# Patient Record
Sex: Female | Born: 1950
Health system: Southern US, Community
[De-identification: ages and names within clinical notes are randomized; demographics above are authoritative.]

## PROBLEM LIST (undated history)

## (undated) DIAGNOSIS — I10 Essential (primary) hypertension: Secondary | ICD-10-CM

## (undated) DIAGNOSIS — K219 Gastro-esophageal reflux disease without esophagitis: Secondary | ICD-10-CM

## (undated) DIAGNOSIS — H269 Unspecified cataract: Secondary | ICD-10-CM

## (undated) DIAGNOSIS — R0602 Shortness of breath: Secondary | ICD-10-CM

## (undated) DIAGNOSIS — A879 Viral meningitis, unspecified: Secondary | ICD-10-CM

## (undated) DIAGNOSIS — M8580 Other specified disorders of bone density and structure, unspecified site: Secondary | ICD-10-CM

## (undated) HISTORY — DX: Gastro-esophageal reflux disease without esophagitis: K21.9

## (undated) HISTORY — PX: MAXILLARY SINUS LIFT: SHX5206

## (undated) HISTORY — PX: HIATAL HERNIA REPAIR: SHX195

## (undated) HISTORY — DX: Unspecified cataract: H26.9

## (undated) HISTORY — PX: TUBAL LIGATION: SHX77

## (undated) HISTORY — PX: AUGMENTATION MAMMAPLASTY: SUR837

## (undated) HISTORY — DX: Other specified disorders of bone density and structure, unspecified site: M85.80

## (undated) HISTORY — PX: ABDOMINAL HYSTERECTOMY: SHX81

---

## 1998-03-08 ENCOUNTER — Ambulatory Visit: Admission: RE | Admit: 1998-03-08 | Discharge: 1998-03-08 | Payer: Self-pay | Admitting: Obstetrics & Gynecology

## 1998-12-25 ENCOUNTER — Ambulatory Visit (HOSPITAL_COMMUNITY): Admission: RE | Admit: 1998-12-25 | Discharge: 1998-12-25 | Payer: Self-pay | Admitting: Obstetrics and Gynecology

## 1999-05-13 ENCOUNTER — Ambulatory Visit (HOSPITAL_COMMUNITY): Admission: RE | Admit: 1999-05-13 | Discharge: 1999-05-13 | Payer: Self-pay | Admitting: Obstetrics and Gynecology

## 2000-02-25 ENCOUNTER — Other Ambulatory Visit: Admission: RE | Admit: 2000-02-25 | Discharge: 2000-02-25 | Payer: Self-pay | Admitting: Obstetrics and Gynecology

## 2000-09-07 ENCOUNTER — Ambulatory Visit (HOSPITAL_COMMUNITY): Admission: RE | Admit: 2000-09-07 | Discharge: 2000-09-07 | Payer: Self-pay | Admitting: Obstetrics and Gynecology

## 2000-09-07 ENCOUNTER — Encounter: Payer: Self-pay | Admitting: Obstetrics and Gynecology

## 2001-03-16 ENCOUNTER — Ambulatory Visit (HOSPITAL_BASED_OUTPATIENT_CLINIC_OR_DEPARTMENT_OTHER): Admission: RE | Admit: 2001-03-16 | Discharge: 2001-03-16 | Payer: Self-pay | Admitting: *Deleted

## 2001-03-16 ENCOUNTER — Encounter (INDEPENDENT_AMBULATORY_CARE_PROVIDER_SITE_OTHER): Payer: Self-pay | Admitting: Specialist

## 2001-03-18 ENCOUNTER — Encounter: Admission: RE | Admit: 2001-03-18 | Discharge: 2001-03-18 | Payer: Self-pay | Admitting: Internal Medicine

## 2001-03-18 ENCOUNTER — Encounter: Payer: Self-pay | Admitting: Internal Medicine

## 2001-05-04 ENCOUNTER — Other Ambulatory Visit: Admission: RE | Admit: 2001-05-04 | Discharge: 2001-05-04 | Payer: Self-pay | Admitting: Obstetrics and Gynecology

## 2001-09-08 ENCOUNTER — Encounter: Payer: Self-pay | Admitting: Obstetrics and Gynecology

## 2001-09-08 ENCOUNTER — Ambulatory Visit (HOSPITAL_COMMUNITY): Admission: RE | Admit: 2001-09-08 | Discharge: 2001-09-08 | Payer: Self-pay | Admitting: Obstetrics and Gynecology

## 2002-08-01 ENCOUNTER — Other Ambulatory Visit: Admission: RE | Admit: 2002-08-01 | Discharge: 2002-08-01 | Payer: Self-pay | Admitting: Obstetrics and Gynecology

## 2002-08-03 ENCOUNTER — Encounter: Payer: Self-pay | Admitting: Obstetrics and Gynecology

## 2002-08-03 ENCOUNTER — Encounter: Admission: RE | Admit: 2002-08-03 | Discharge: 2002-08-03 | Payer: Self-pay | Admitting: Obstetrics and Gynecology

## 2002-09-14 ENCOUNTER — Ambulatory Visit (HOSPITAL_COMMUNITY): Admission: RE | Admit: 2002-09-14 | Discharge: 2002-09-14 | Payer: Self-pay | Admitting: Obstetrics and Gynecology

## 2002-09-14 ENCOUNTER — Encounter: Payer: Self-pay | Admitting: Obstetrics and Gynecology

## 2003-08-03 ENCOUNTER — Encounter: Payer: Self-pay | Admitting: Infectious Diseases

## 2003-08-03 ENCOUNTER — Ambulatory Visit (HOSPITAL_COMMUNITY): Admission: RE | Admit: 2003-08-03 | Discharge: 2003-08-03 | Payer: Self-pay | Admitting: Infectious Diseases

## 2003-08-07 ENCOUNTER — Other Ambulatory Visit: Admission: RE | Admit: 2003-08-07 | Discharge: 2003-08-07 | Payer: Self-pay | Admitting: Obstetrics and Gynecology

## 2003-09-25 ENCOUNTER — Ambulatory Visit (HOSPITAL_COMMUNITY): Admission: RE | Admit: 2003-09-25 | Discharge: 2003-09-25 | Payer: Self-pay | Admitting: Obstetrics and Gynecology

## 2003-11-14 ENCOUNTER — Ambulatory Visit (HOSPITAL_BASED_OUTPATIENT_CLINIC_OR_DEPARTMENT_OTHER): Admission: RE | Admit: 2003-11-14 | Discharge: 2003-11-14 | Payer: Self-pay | Admitting: Plastic Surgery

## 2005-03-17 ENCOUNTER — Other Ambulatory Visit: Admission: RE | Admit: 2005-03-17 | Discharge: 2005-03-17 | Payer: Self-pay | Admitting: Obstetrics and Gynecology

## 2005-03-23 ENCOUNTER — Ambulatory Visit (HOSPITAL_COMMUNITY): Admission: RE | Admit: 2005-03-23 | Discharge: 2005-03-23 | Payer: Self-pay | Admitting: Obstetrics and Gynecology

## 2005-04-01 ENCOUNTER — Ambulatory Visit (HOSPITAL_COMMUNITY): Admission: RE | Admit: 2005-04-01 | Discharge: 2005-04-01 | Payer: Self-pay | Admitting: Obstetrics and Gynecology

## 2006-05-11 ENCOUNTER — Ambulatory Visit (HOSPITAL_COMMUNITY): Admission: RE | Admit: 2006-05-11 | Discharge: 2006-05-11 | Payer: Self-pay | Admitting: Obstetrics and Gynecology

## 2007-05-18 ENCOUNTER — Ambulatory Visit (HOSPITAL_COMMUNITY): Admission: RE | Admit: 2007-05-18 | Discharge: 2007-05-18 | Payer: Self-pay | Admitting: Obstetrics and Gynecology

## 2007-11-24 DIAGNOSIS — M8580 Other specified disorders of bone density and structure, unspecified site: Secondary | ICD-10-CM

## 2007-11-24 HISTORY — DX: Other specified disorders of bone density and structure, unspecified site: M85.80

## 2007-12-20 ENCOUNTER — Ambulatory Visit: Payer: Self-pay | Admitting: Internal Medicine

## 2008-01-03 ENCOUNTER — Ambulatory Visit: Payer: Self-pay | Admitting: Internal Medicine

## 2008-01-03 ENCOUNTER — Encounter: Payer: Self-pay | Admitting: Internal Medicine

## 2008-04-30 ENCOUNTER — Other Ambulatory Visit: Admission: RE | Admit: 2008-04-30 | Discharge: 2008-04-30 | Payer: Self-pay | Admitting: Family Medicine

## 2008-05-16 ENCOUNTER — Ambulatory Visit (HOSPITAL_COMMUNITY): Admission: RE | Admit: 2008-05-16 | Discharge: 2008-05-16 | Payer: Self-pay | Admitting: Family Medicine

## 2008-05-22 ENCOUNTER — Ambulatory Visit (HOSPITAL_COMMUNITY): Admission: RE | Admit: 2008-05-22 | Discharge: 2008-05-22 | Payer: Self-pay | Admitting: Family Medicine

## 2009-05-24 ENCOUNTER — Ambulatory Visit (HOSPITAL_COMMUNITY): Admission: RE | Admit: 2009-05-24 | Discharge: 2009-05-24 | Payer: Self-pay | Admitting: Family Medicine

## 2009-06-06 ENCOUNTER — Other Ambulatory Visit: Admission: RE | Admit: 2009-06-06 | Discharge: 2009-06-06 | Payer: Self-pay | Admitting: Family Medicine

## 2010-05-28 ENCOUNTER — Ambulatory Visit (HOSPITAL_COMMUNITY): Admission: RE | Admit: 2010-05-28 | Discharge: 2010-05-28 | Payer: Self-pay | Admitting: Family Medicine

## 2010-07-22 ENCOUNTER — Other Ambulatory Visit: Admission: RE | Admit: 2010-07-22 | Discharge: 2010-07-22 | Payer: Self-pay | Admitting: Family Medicine

## 2010-12-13 ENCOUNTER — Encounter: Payer: Self-pay | Admitting: Obstetrics and Gynecology

## 2011-04-10 NOTE — Op Note (Signed)
[place]  Patient:    Alexis Guerrero, Alexis Guerrero                      MRN: [id number] Proc. Date: 03/16/01 Adm. Date:  [id number] Attending:  Aundria Mems                           Operative Report  PREOPERATIVE DIAGNOSIS: 1. Bilateral nasal/ethmoid polyposis. 2. Recurrent ethmoid maxillary sinusitis. 3. Hyperplastic nasal turbinates. 4. Allergic rhinitis.  POSTOPERATIVE DIAGNOSIS: 1. Bilateral nasal/ethmoid polyposis. 2. Recurrent ethmoid maxillary sinusitis. 3. Hyperplastic nasal turbinates. 4. Allergic rhinitis.  OPERATION PERFORMED: 1. Bilateral endoscopic complete ethmoidectomies. 2. Bilateral endoscopic osteomeatal openings. 3. Bilateral outfracture of inferior turbinates.  SURGEON:  Kathy Breach, M.D.  ANESTHESIA:  General orotracheal.  DESCRIPTION OF PROCEDURE:  With the patient under general orotracheal anesthesia, nasal block with 4% Xylocaine with ephedrine solution on olive tipped probes applied to the sphenopalatine and anterior ethmoid nerve areas bilaterally.  Nasal packs soaked in similar solution inserted along the middle and inferior turbinates bilaterally.  Middle meatal mucosa and lateral middle turbinate mucosa infiltrated with 1% Xylocaine with 1:100,000 epinephrine bilaterally for vasoconstriction.  Inspection of the patients nose showed near midline straight septum, prominent inferior and middle turbinates bilaterally.  The middle meatal mucosa peeking out under turbinates, early polypoid disease.  Also polypoid degeneration of the mucosa on the medial posterolateral surface of the left middle turbinate more so than the right. Under endoscopic visualization beginning on the left side with suction debrider, polypoid disease was taken down, taking down the anterior ethmoid cells through the vertical portion of the lamellar plate and the posterior cells, all ethmoid cells filled completely with polypoid mucosa and  tissue. No purulence was encountered.  Complete ethmoidectomy done initially on the left side.  Area of the osteomeatal window probed to find the area and opened to give the opening through the osteomeatal complex into the left maxillary sinus.  Similar procedure with similar findings done on the right side, essentially completing a complete endoscopic ethmoidectomy bilaterally and opening the ostial areas into the maxillary sinuses.  Both prominent inferior turbinates were then outfractured.  The patient had no significant bleeding at completion of the case.  Estimated blood loss for this procedure was perhaps 200 cc but impossible to judge with using the suction debrider and constant irrigation throughout.  The patients oropharynx was cleared, no significant bleeding down to the posterior pharyngeal wall at completion of the case.  The patient tolerated the procedure well and was taken to the recovery room in stable general condition. DD:  03/16/01 TD:  03/16/01 Job: [id number]

## 2011-04-10 NOTE — Op Note (Signed)
NAME:  Alexis Guerrero, Alexis Guerrero                         ACCOUNT NO.:  [id number]   MEDICAL RECORD NO.:  [id number]                   PATIENT TYPE:  AMB   LOCATION:  DSC                                  FACILITY:  [facility]   PHYSICIAN:  Alfredia Ferguson, M.D.               DATE OF BIRTH:  May 16, 1951   DATE OF PROCEDURE:  11/14/2003  DATE OF DISCHARGE:                                 OPERATIVE REPORT   PREOPERATIVE DIAGNOSIS:  A 9 mm epidermal inclusion cyst, right preauricular  area.   POSTOPERATIVE DIAGNOSIS:  A 9 mm epidermal inclusion cyst, right  preauricular area.   OPERATION PERFORMED:  Elliptical excision of epidermal inclusion cyst, right  preauricular area.   SURGEON:  Alfredia Ferguson, M.D.   ANESTHESIA:  2% Xylocaine with 1:100,000 epinephrine.   INDICATIONS FOR PROCEDURE:  The patient is a 60 year old woman with a slowly  enlarging cyst in her right preauricular area.  It has been chronically  draining.  She wishes to have it excised before it had a chance to get  infected.  She understands that she is trading what she has for a permanent  potentially unsightly preauricular scar.  In spite of that she wished to  proceed with the surgery.   DESCRIPTION OF PROCEDURE:  Skin markers were placed in elliptical fashion  around the diseased punctum in the right preauricular area.  The incision  was oriented in a vertical direction.  Local anesthesia was infiltrated and  the right face was prepped with Betadine and draped with sterile drapes.  Elliptical excision of the skin was made down to the subcutaneous tissue.  Using sharp dissection, the cyst was identified and dissected away from the  surrounding subcutaneous tissue.  It was removed in total.  Hemostasis was  accomplished using cautery.  Specimen was not sent to pathology since it was  a simple cyst.  The wound was closed by approximating the dermis using  multiple interrupted 5-0 Monocryl for the dermis.  Skin edges were united  using Steri-Strips.  A light dressing was applied and the patient was  discharged to home in satisfactory condition.                                               Alfredia Ferguson, M.D.    WBB/MEDQ  D:  11/14/2003  T:  11/15/2003  Job:  [id number]

## 2011-05-14 ENCOUNTER — Other Ambulatory Visit (INDEPENDENT_AMBULATORY_CARE_PROVIDER_SITE_OTHER): Payer: Self-pay | Admitting: Otolaryngology

## 2011-05-14 DIAGNOSIS — J320 Chronic maxillary sinusitis: Secondary | ICD-10-CM

## 2011-05-15 ENCOUNTER — Ambulatory Visit
Admission: RE | Admit: 2011-05-15 | Discharge: 2011-05-15 | Disposition: A | Discharge status: Home / Self Care | Payer: Commercial Managed Care - PPO | Source: Ambulatory Visit | Attending: Otolaryngology | Admitting: Otolaryngology

## 2011-05-15 ENCOUNTER — Other Ambulatory Visit: Payer: Self-pay

## 2011-05-15 DIAGNOSIS — J320 Chronic maxillary sinusitis: Secondary | ICD-10-CM

## 2011-05-25 ENCOUNTER — Other Ambulatory Visit (HOSPITAL_COMMUNITY): Payer: Self-pay | Admitting: Family Medicine

## 2011-05-25 DIAGNOSIS — Z1231 Encounter for screening mammogram for malignant neoplasm of breast: Secondary | ICD-10-CM

## 2011-06-08 ENCOUNTER — Ambulatory Visit (HOSPITAL_COMMUNITY)
Admission: RE | Admit: 2011-06-08 | Discharge: 2011-06-08 | Disposition: A | Discharge status: Home / Self Care | Payer: 59 | Source: Ambulatory Visit | Attending: Family Medicine | Admitting: Family Medicine

## 2011-06-08 DIAGNOSIS — Z1231 Encounter for screening mammogram for malignant neoplasm of breast: Secondary | ICD-10-CM | POA: Insufficient documentation

## 2011-08-20 ENCOUNTER — Other Ambulatory Visit: Payer: Self-pay | Admitting: Family Medicine

## 2011-08-20 ENCOUNTER — Other Ambulatory Visit (HOSPITAL_COMMUNITY)
Admission: RE | Admit: 2011-08-20 | Discharge: 2011-08-20 | Disposition: A | Discharge status: Home / Self Care | Payer: 59 | Source: Ambulatory Visit | Attending: Family Medicine | Admitting: Family Medicine

## 2011-08-20 DIAGNOSIS — Z124 Encounter for screening for malignant neoplasm of cervix: Secondary | ICD-10-CM | POA: Insufficient documentation

## 2011-08-21 ENCOUNTER — Other Ambulatory Visit (HOSPITAL_COMMUNITY): Payer: Self-pay | Admitting: Family Medicine

## 2011-08-21 DIAGNOSIS — M8580 Other specified disorders of bone density and structure, unspecified site: Secondary | ICD-10-CM

## 2011-09-01 ENCOUNTER — Ambulatory Visit (HOSPITAL_COMMUNITY): Payer: Commercial Managed Care - PPO

## 2011-09-03 ENCOUNTER — Ambulatory Visit (HOSPITAL_COMMUNITY)
Admission: RE | Admit: 2011-09-03 | Discharge: 2011-09-03 | Disposition: A | Discharge status: Home / Self Care | Payer: 59 | Source: Ambulatory Visit | Attending: Family Medicine | Admitting: Family Medicine

## 2011-09-03 DIAGNOSIS — M8580 Other specified disorders of bone density and structure, unspecified site: Secondary | ICD-10-CM

## 2011-09-03 DIAGNOSIS — Z13820 Encounter for screening for osteoporosis: Secondary | ICD-10-CM | POA: Insufficient documentation

## 2011-09-03 DIAGNOSIS — Z780 Asymptomatic menopausal state: Secondary | ICD-10-CM | POA: Insufficient documentation

## 2011-10-13 ENCOUNTER — Encounter (HOSPITAL_COMMUNITY): Payer: Self-pay

## 2011-10-13 ENCOUNTER — Encounter (HOSPITAL_COMMUNITY)
Admission: RE | Admit: 2011-10-13 | Discharge: 2011-10-13 | Disposition: A | Discharge status: Home / Self Care | Payer: 59 | Source: Ambulatory Visit | Attending: Ophthalmology | Admitting: Ophthalmology

## 2011-10-13 ENCOUNTER — Other Ambulatory Visit: Payer: Self-pay

## 2011-10-13 ENCOUNTER — Encounter (HOSPITAL_COMMUNITY): Payer: Self-pay | Admitting: Pharmacy Technician

## 2011-10-13 HISTORY — DX: Shortness of breath: R06.02

## 2011-10-13 HISTORY — DX: Essential (primary) hypertension: I10

## 2011-10-13 NOTE — Patient Instructions (Addendum)
20 Alexis Guerrero  10/13/2011   Your procedure is scheduled on:  10/20/2011  Report to [facility] at  615  AM.  Call this number if you have problems the morning of surgery: [phone]   Remember:   Do not eat food:After Midnight.  Do not drink clear liquids: After Midnight.  Take these medicines the morning of surgery with A SIP OF WATER: Benicar,clartin. Take symbicort before you come.   Do not wear jewelry, make-up or nail polish.  Do not wear lotions, powders, or perfumes. You may wear deodorant.  Do not shave 48 hours prior to surgery.  Do not bring valuables to the hospital.  Contacts, dentures or bridgework may not be worn into surgery.  Leave suitcase in the car. After surgery it may be brought to your room.  For patients admitted to the hospital, checkout time is 11:00 AM the day of discharge.   Patients discharged the day of surgery will not be allowed to drive home.  Name and phone number of your driver: family  Special Instructions: N/A   Please read over the following fact sheets that you were given: Pain Booklet, MRSA Information, Surgical Site Infection Prevention, Anesthesia Post-op Instructions and Care and Recovery After Surgery PATIENT INSTRUCTIONS POST-ANESTHESIA  IMMEDIATELY FOLLOWING SURGERY:  Do not drive or operate machinery for the first twenty four hours after surgery.  Do not make any important decisions for twenty four hours after surgery or while taking narcotic pain medications or sedatives.  If you develop intractable nausea and vomiting or a severe headache please notify your doctor immediately.  FOLLOW-UP:  Please make an appointment with your surgeon as instructed. You do not need to follow up with anesthesia unless specifically instructed to do so.  WOUND CARE INSTRUCTIONS (if applicable):  Keep a dry clean dressing on the anesthesia/puncture wound site if there is drainage.  Once the wound has quit draining you may leave it open to air.  Generally  you should leave the bandage intact for twenty four hours unless there is drainage.  If the epidural site drains for more than 36-48 hours please call the anesthesia department.  QUESTIONS?:  Please feel free to call your physician or the hospital operator if you have any questions, and they will be happy to assist you.     [facility] Anesthesia Department [address] [place] [phone]

## 2011-10-20 ENCOUNTER — Encounter (HOSPITAL_COMMUNITY)
Admission: RE | Disposition: A | Discharge status: Home / Self Care | Payer: Self-pay | Source: Ambulatory Visit | Attending: Ophthalmology

## 2011-10-20 ENCOUNTER — Encounter (HOSPITAL_COMMUNITY): Payer: Self-pay | Admitting: Anesthesiology

## 2011-10-20 ENCOUNTER — Encounter (HOSPITAL_COMMUNITY): Payer: Self-pay | Admitting: *Deleted

## 2011-10-20 ENCOUNTER — Ambulatory Visit (HOSPITAL_COMMUNITY)
Admission: RE | Admit: 2011-10-20 | Discharge: 2011-10-20 | Disposition: A | Discharge status: Home / Self Care | Payer: 59 | Source: Ambulatory Visit | Attending: Ophthalmology | Admitting: Ophthalmology

## 2011-10-20 ENCOUNTER — Ambulatory Visit (HOSPITAL_COMMUNITY): Payer: 59 | Admitting: Anesthesiology

## 2011-10-20 DIAGNOSIS — Z79899 Other long term (current) drug therapy: Secondary | ICD-10-CM | POA: Insufficient documentation

## 2011-10-20 DIAGNOSIS — I10 Essential (primary) hypertension: Secondary | ICD-10-CM | POA: Insufficient documentation

## 2011-10-20 DIAGNOSIS — H2510 Age-related nuclear cataract, unspecified eye: Secondary | ICD-10-CM | POA: Insufficient documentation

## 2011-10-20 DIAGNOSIS — Z01812 Encounter for preprocedural laboratory examination: Secondary | ICD-10-CM | POA: Insufficient documentation

## 2011-10-20 DIAGNOSIS — Z01810 Encounter for preprocedural cardiovascular examination: Secondary | ICD-10-CM | POA: Insufficient documentation

## 2011-10-20 HISTORY — PX: CATARACT EXTRACTION W/PHACO: SHX586

## 2011-10-20 SURGERY — PHACOEMULSIFICATION, CATARACT, WITH IOL INSERTION
Anesthesia: Monitor Anesthesia Care | Site: Eye | Laterality: Right | Wound class: Clean

## 2011-10-20 MED ORDER — LIDOCAINE HCL (PF) 2 % IJ SOLN
INTRAMUSCULAR | Status: AC
  Filled 2011-10-20: qty 2

## 2011-10-20 MED ORDER — TETRACAINE HCL 0.5 % OP SOLN
1.0000 [drp] | OPHTHALMIC | Status: AC
  Administered 2011-10-20 (×3): 1 [drp] via OPHTHALMIC

## 2011-10-20 MED ORDER — BSS IO SOLN
INTRAOCULAR | Status: DC | PRN
  Administered 2011-10-20: 15 mL via INTRAOCULAR

## 2011-10-20 MED ORDER — MIDAZOLAM HCL 2 MG/2ML IJ SOLN
1.0000 mg | INTRAMUSCULAR | Status: DC | PRN
  Administered 2011-10-20 (×2): 2 mg via INTRAVENOUS

## 2011-10-20 MED ORDER — MIDAZOLAM HCL 2 MG/2ML IJ SOLN
INTRAMUSCULAR | Status: AC
  Filled 2011-10-20: qty 2

## 2011-10-20 MED ORDER — EPINEPHRINE HCL 1 MG/ML IJ SOLN
INTRAMUSCULAR | Status: AC
  Filled 2011-10-20: qty 1

## 2011-10-20 MED ORDER — EPINEPHRINE HCL 1 MG/ML IJ SOLN
INTRAOCULAR | Status: DC | PRN
  Administered 2011-10-20: 07:00:00

## 2011-10-20 MED ORDER — TETRACAINE HCL 0.5 % OP SOLN
OPHTHALMIC | Status: AC
  Administered 2011-10-20: 1 [drp] via OPHTHALMIC
  Filled 2011-10-20: qty 2

## 2011-10-20 MED ORDER — FLURBIPROFEN SODIUM 0.03 % OP SOLN
OPHTHALMIC | Status: AC
  Administered 2011-10-20: 1 [drp] via OPHTHALMIC
  Filled 2011-10-20: qty 2.5

## 2011-10-20 MED ORDER — LACTATED RINGERS IV SOLN
INTRAVENOUS | Status: DC
  Administered 2011-10-20: 1000 mL via INTRAVENOUS

## 2011-10-20 MED ORDER — PHENYLEPHRINE HCL 2.5 % OP SOLN
1.0000 [drp] | OPHTHALMIC | Status: AC
  Administered 2011-10-20 (×3): 1 [drp] via OPHTHALMIC

## 2011-10-20 MED ORDER — MIDAZOLAM HCL 5 MG/5ML IJ SOLN
INTRAMUSCULAR | Status: DC | PRN
  Administered 2011-10-20: 2 mg via INTRAVENOUS

## 2011-10-20 MED ORDER — TETRACAINE 0.5 % OP SOLN OPTIME - NO CHARGE
OPHTHALMIC | Status: DC | PRN
  Administered 2011-10-20: 2 [drp] via OPHTHALMIC

## 2011-10-20 MED ORDER — MIDAZOLAM HCL 2 MG/2ML IJ SOLN
INTRAMUSCULAR | Status: AC
  Administered 2011-10-20: 2 mg via INTRAVENOUS
  Filled 2011-10-20: qty 2

## 2011-10-20 MED ORDER — PHENYLEPHRINE HCL 2.5 % OP SOLN
OPHTHALMIC | Status: AC
  Administered 2011-10-20: 1 [drp] via OPHTHALMIC
  Filled 2011-10-20: qty 2

## 2011-10-20 MED ORDER — CYCLOPENTOLATE-PHENYLEPHRINE 0.2-1 % OP SOLN
1.0000 [drp] | OPHTHALMIC | Status: AC
  Administered 2011-10-20 (×3): 1 [drp] via OPHTHALMIC

## 2011-10-20 MED ORDER — LIDOCAINE HCL (PF) 2 % IJ SOLN
INTRAMUSCULAR | Status: DC | PRN
  Administered 2011-10-20: .5 mL

## 2011-10-20 MED ORDER — FLURBIPROFEN SODIUM 0.03 % OP SOLN
1.0000 [drp] | OPHTHALMIC | Status: AC
  Administered 2011-10-20 (×3): 1 [drp] via OPHTHALMIC

## 2011-10-20 MED ORDER — CYCLOPENTOLATE-PHENYLEPHRINE 0.2-1 % OP SOLN
OPHTHALMIC | Status: AC
  Administered 2011-10-20: 1 [drp] via OPHTHALMIC
  Filled 2011-10-20: qty 2

## 2011-10-20 MED ORDER — PROVISC 10 MG/ML IO SOLN
INTRAOCULAR | Status: DC | PRN
  Administered 2011-10-20: 8.5 mg via INTRAOCULAR

## 2011-10-20 SURGICAL SUPPLY — 21 items
CAPSULAR TENSION RING-AMO (OPHTHALMIC RELATED) IMPLANT
CLOTH BEACON ORANGE TIMEOUT ST (SAFETY) ×2 IMPLANT
EYE SHIELD UNIVERSAL CLEAR (GAUZE/BANDAGES/DRESSINGS) ×2 IMPLANT
GLOVE BIOGEL M 6.5 STRL (GLOVE) ×2 IMPLANT
GLOVE ECLIPSE 6.5 STRL STRAW (GLOVE) IMPLANT
GLOVE ECLIPSE 7.0 STRL STRAW (GLOVE) IMPLANT
GLOVE EXAM NITRILE LRG STRL (GLOVE) ×2 IMPLANT
GLOVE EXAM NITRILE MD LF STRL (GLOVE) IMPLANT
GLOVE SKINSENSE NS SZ6.5 (GLOVE)
GLOVE SKINSENSE STRL SZ6.5 (GLOVE) IMPLANT
HEALON 5 0.6 ML (INTRAOCULAR LENS) IMPLANT
KIT VITRECTOMY (OPHTHALMIC RELATED) IMPLANT
PAD ARMBOARD 7.5X6 YLW CONV (MISCELLANEOUS) ×2 IMPLANT
PROC W NO LENS (INTRAOCULAR LENS)
PROC W SPEC LENS (INTRAOCULAR LENS)
PROCESS W NO LENS (INTRAOCULAR LENS) IMPLANT
PROCESS W SPEC LENS (INTRAOCULAR LENS) IMPLANT
RING MALYGIN (MISCELLANEOUS) IMPLANT
SIGHTPATH CAT PROC W REG LENS (Ophthalmic Related) ×2 IMPLANT
VISCOELASTIC ADDITIONAL (OPHTHALMIC RELATED) IMPLANT
WATER STERILE IRR 250ML POUR (IV SOLUTION) ×2 IMPLANT

## 2011-10-20 NOTE — Op Note (Signed)
Patient brought to the operating room and prepped and draped in the usual manner.  Lid speculum inserted in right eye.  Stab incision made at the twelve o'clock position.  Provisc instilled in the anterior chamber.   A 2.4 mm. Stab incision was made temporally.  An anterior capsulotomy was done with a bent 25 gauge needle.  The nucleus was hydrodissected.  The Phaco tip was inserted in the anterior chamber and the nucleus was emulsified.  CDE was 8.54.  The cortical material was then removed with the I and A tip.  Posterior capsule was the polished.  The anterior chamber was deepened with Provisc.  A 25.5 Diopter Alcon [id number] IOL was then inserted in the capsular bag.  Provisc was then removed with the I and A tip.  The wound was then hydrated.  Patient sent to the Recovery Room in good condition with follow up in my office.

## 2011-10-20 NOTE — Anesthesia Postprocedure Evaluation (Signed)
  Anesthesia Post-op Note  Patient: Alexis Guerrero  Procedure(s) Performed:  CATARACT EXTRACTION PHACO AND INTRAOCULAR LENS PLACEMENT (IOC)  Patient Location: PACU and Short Stay  Anesthesia Type: MAC  Level of Consciousness: awake  Airway and Oxygen Therapy: Patient Spontanous Breathing  Post-op Pain: none  Post-op Assessment: Post-op Vital signs reviewed, Patient's Cardiovascular Status Stable, Respiratory Function Stable and No signs of Nausea or vomiting  Post-op Vital Signs: Reviewed and stable  Complications: No apparent anesthesia complications

## 2011-10-20 NOTE — H&P (Signed)
The patient was re examined and there is no change in the patients condition since the original H and P. 

## 2011-10-20 NOTE — Anesthesia Preprocedure Evaluation (Addendum)
Anesthesia Evaluation  Patient identified by MRN, date of birth, ID band Patient awake    Reviewed: Allergy & Precautions, H&P , NPO status , Patient's Chart, lab work & pertinent test results  History of Anesthesia Complications Negative for: history of anesthetic complications  Airway Mallampati: I      Dental  (+) Teeth Intact   Pulmonary shortness of breath, asthma ,  clear to auscultation        Cardiovascular hypertension, Pt. on medications Regular Normal    Neuro/Psych    GI/Hepatic   Endo/Other    Renal/GU      Musculoskeletal   Abdominal   Peds  Hematology   Anesthesia Other Findings   Reproductive/Obstetrics                           Anesthesia Physical Anesthesia Plan  ASA: II  Anesthesia Plan: MAC   Post-op Pain Management:    Induction: Intravenous  Airway Management Planned: Nasal Cannula  Additional Equipment:   Intra-op Plan:   Post-operative Plan:   Informed Consent: I have reviewed the patients History and Physical, chart, labs and discussed the procedure including the risks, benefits and alternatives for the proposed anesthesia with the patient or authorized representative who has indicated his/her understanding and acceptance.     Plan Discussed with:   Anesthesia Plan Comments:        Anesthesia Quick Evaluation

## 2011-10-20 NOTE — Transfer of Care (Signed)
Immediate Anesthesia Transfer of Care Note  Patient: Alexis Guerrero  Procedure(s) Performed:  CATARACT EXTRACTION PHACO AND INTRAOCULAR LENS PLACEMENT (IOC)  Patient Location: PACU and Short Stay  Anesthesia Type: MAC  Level of Consciousness: awake and oriented  Airway & Oxygen Therapy: Patient Spontanous Breathing  Post-op Assessment: Report given to PACU RN  Post vital signs: Reviewed and stable  Complications: No apparent anesthesia complications

## 2011-10-21 NOTE — Addendum Note (Signed)
Addendum  created 10/21/11 1517 by Glynn Octave   Modules edited:Charting, Inpatient Notes

## 2011-10-27 ENCOUNTER — Encounter (HOSPITAL_COMMUNITY): Payer: Self-pay | Admitting: Ophthalmology

## 2011-11-03 ENCOUNTER — Encounter (HOSPITAL_BASED_OUTPATIENT_CLINIC_OR_DEPARTMENT_OTHER): Payer: Self-pay

## 2011-11-03 ENCOUNTER — Emergency Department (HOSPITAL_BASED_OUTPATIENT_CLINIC_OR_DEPARTMENT_OTHER)
Admission: EM | Admit: 2011-11-03 | Discharge: 2011-11-03 | Disposition: A | Discharge status: Home / Self Care | Payer: 59 | Attending: Emergency Medicine | Admitting: Emergency Medicine

## 2011-11-03 DIAGNOSIS — Z79899 Other long term (current) drug therapy: Secondary | ICD-10-CM | POA: Insufficient documentation

## 2011-11-03 DIAGNOSIS — J45909 Unspecified asthma, uncomplicated: Secondary | ICD-10-CM | POA: Insufficient documentation

## 2011-11-03 DIAGNOSIS — R0602 Shortness of breath: Secondary | ICD-10-CM | POA: Insufficient documentation

## 2011-11-03 DIAGNOSIS — R51 Headache: Secondary | ICD-10-CM | POA: Insufficient documentation

## 2011-11-03 DIAGNOSIS — B9789 Other viral agents as the cause of diseases classified elsewhere: Secondary | ICD-10-CM | POA: Insufficient documentation

## 2011-11-03 DIAGNOSIS — I10 Essential (primary) hypertension: Secondary | ICD-10-CM | POA: Insufficient documentation

## 2011-11-03 DIAGNOSIS — B349 Viral infection, unspecified: Secondary | ICD-10-CM

## 2011-11-03 HISTORY — DX: Viral meningitis, unspecified: A87.9

## 2011-11-03 MED ORDER — DIPHENHYDRAMINE HCL 50 MG/ML IJ SOLN
25.0000 mg | Freq: Once | INTRAMUSCULAR | Status: AC
  Administered 2011-11-03: 25 mg via INTRAVENOUS
  Filled 2011-11-03: qty 1

## 2011-11-03 MED ORDER — METOCLOPRAMIDE HCL 5 MG/ML IJ SOLN
10.0000 mg | Freq: Once | INTRAMUSCULAR | Status: AC
  Administered 2011-11-03: 10 mg via INTRAVENOUS
  Filled 2011-11-03: qty 2

## 2011-11-03 MED ORDER — SODIUM CHLORIDE 0.9 % IV BOLUS (SEPSIS)
1000.0000 mL | Freq: Once | INTRAVENOUS | Status: AC
  Administered 2011-11-03: 1000 mL via INTRAVENOUS

## 2011-11-03 MED ORDER — KETOROLAC TROMETHAMINE 30 MG/ML IJ SOLN
30.0000 mg | Freq: Once | INTRAMUSCULAR | Status: AC
  Administered 2011-11-03: 30 mg via INTRAVENOUS
  Filled 2011-11-03: qty 1

## 2011-11-03 MED ORDER — TRAMADOL HCL 50 MG PO TABS: 50.0000 mg | ORAL_TABLET | Freq: Four times a day (QID) | ORAL | Status: AC | PRN

## 2011-11-03 NOTE — ED Notes (Signed)
Pt reports a frontal headache, nausea, fever and generalized body aches that started last pm.

## 2011-11-03 NOTE — ED Provider Notes (Signed)
History     CSN: [id number] Arrival date & time: 11/03/2011 11:49 AM   First MD Initiated Contact with Patient 11/03/11 1204      Chief Complaint  Patient presents with  . Headache  . Fever    (Consider location/radiation/quality/duration/timing/severity/associated sxs/prior treatment) Patient is a 60 y.o. female presenting with headaches and fever. The history is provided by the patient. No language interpreter was used.  Headache  This is a new problem. The current episode started yesterday. The problem occurs constantly. The problem has not changed since onset.The headache is associated with an unknown factor. The pain is located in the frontal region. The quality of the pain is described as throbbing. The pain is moderate. The pain does not radiate. Associated symptoms include a fever and malaise/fatigue. Pertinent negatives include no shortness of breath, no nausea and no vomiting. Treatments tried: hydrocodone  The treatment provided no relief.  Fever Primary symptoms of the febrile illness include fever and headaches. Primary symptoms do not include shortness of breath, nausea or vomiting.    Past Medical History  Diagnosis Date  . Hypertension   . Asthma   . Shortness of breath   . Viral meningitis     Past Surgical History  Procedure Date  . Hiatal hernia repair age 52  . Abdominal hysterectomy   . Tubal ligation   . Maxillary sinus lift   . Breast enhancement surgery   . Cataract extraction w/phaco 10/20/2011    Procedure: CATARACT EXTRACTION PHACO AND INTRAOCULAR LENS PLACEMENT (IOC);  Surgeon: Loraine Leriche T. Nile Riggs;  Location: [facility];  Service: Ophthalmology;  Laterality: Right;  CDE:8.54    Family History  Problem Relation Age of Onset  . Anesthesia problems Neg Hx   . Hypotension Neg Hx   . Malignant hyperthermia Neg Hx   . Pseudochol deficiency Neg Hx     History  Substance Use Topics  . Smoking status: Never Smoker   . Smokeless tobacco: Not on file  .  Alcohol Use: 0.6 oz/week    1 Glasses of wine per week     occassional    OB History    Grav Para Term Preterm Abortions TAB SAB Ect Mult Living                  Review of Systems  Constitutional: Positive for fever and malaise/fatigue.  Respiratory: Negative for shortness of breath.   Gastrointestinal: Negative for nausea and vomiting.  Neurological: Positive for headaches.  All other systems reviewed and are negative.    Allergies  Aspirin; Sulfa antibiotics; and Strawberry  Home Medications   Current Outpatient Rx  Name Route Sig Dispense Refill  . ACETAMINOPHEN 500 MG PO TABS Oral Take 1,000 mg by mouth every 6 (six) hours as needed. For pain     . BUDESONIDE-FORMOTEROL FUMARATE 80-4.5 MCG/ACT IN AERO Inhalation Inhale 2 puffs into the lungs daily.      Marland Kitchen CALCIUM CARBONATE-VITAMIN D 500-200 MG-UNIT PO TABS Oral Take 1 tablet by mouth daily.      Marland Kitchen KETOTIFEN FUMARATE 0.025 % OP SOLN Ophthalmic Apply 1 drop to eye as needed. For dry eyes     . MONTELUKAST SODIUM 10 MG PO TABS Oral Take 10 mg by mouth at bedtime.      Marland Kitchen OLMESARTAN MEDOXOMIL-HCTZ 20-12.5 MG PO TABS Oral Take 1 tablet by mouth daily.      . TOLTERODINE TARTRATE 2 MG PO TABS Oral Take 2 mg by mouth as needed. constancy  BP 140/77  Pulse 89  Temp(Src) 99.1 F (37.3 C) (Oral)  Resp 16  Ht 5\' 1"  (1.549 m)  Wt 141 lb (63.957 kg)  BMI 26.64 kg/m2  SpO2 100%  Physical Exam  Nursing note and vitals reviewed. Constitutional: She is oriented to person, place, and time. She appears well-developed and well-nourished.  HENT:  Head: Normocephalic and atraumatic.  Right Ear: External ear normal.  Left Ear: External ear normal.  Mouth/Throat: Oropharynx is clear and moist.  Eyes: Pupils are equal, round, and reactive to light.  Neck: Normal range of motion. Neck supple.  Cardiovascular: Normal rate and regular rhythm.   Pulmonary/Chest: Effort normal and breath sounds normal.  Musculoskeletal: Normal  range of motion.  Neurological: She is alert and oriented to person, place, and time.  Skin: Skin is warm and dry.  Psychiatric: She has a normal mood and affect.    ED Course  Procedures (including critical care time)  Labs Reviewed - No data to display No results found.   1. Viral illness       MDM  Pt feeling better at this time and is ready to go home:likily ili:don't meningitis        Teressa Lower, NP 11/03/11 1441

## 2011-11-03 NOTE — ED Provider Notes (Signed)
Medical screening examination/treatment/procedure(s) were performed by non-physician practitioner and as supervising physician I was immediately available for consultation/collaboration.  Tenoch Mcclure, MD 11/03/11 1536 

## 2012-04-08 ENCOUNTER — Emergency Department (HOSPITAL_BASED_OUTPATIENT_CLINIC_OR_DEPARTMENT_OTHER)
Admission: EM | Admit: 2012-04-08 | Discharge: 2012-04-08 | Disposition: A | Discharge status: Home / Self Care | Payer: 59 | Attending: Emergency Medicine | Admitting: Emergency Medicine

## 2012-04-08 ENCOUNTER — Encounter (HOSPITAL_BASED_OUTPATIENT_CLINIC_OR_DEPARTMENT_OTHER): Payer: Self-pay

## 2012-04-08 DIAGNOSIS — I10 Essential (primary) hypertension: Secondary | ICD-10-CM | POA: Insufficient documentation

## 2012-04-08 DIAGNOSIS — Z79899 Other long term (current) drug therapy: Secondary | ICD-10-CM | POA: Insufficient documentation

## 2012-04-08 DIAGNOSIS — I4949 Other premature depolarization: Secondary | ICD-10-CM | POA: Insufficient documentation

## 2012-04-08 DIAGNOSIS — I493 Ventricular premature depolarization: Secondary | ICD-10-CM

## 2012-04-08 DIAGNOSIS — J45909 Unspecified asthma, uncomplicated: Secondary | ICD-10-CM | POA: Insufficient documentation

## 2012-04-08 LAB — DIFFERENTIAL
Basophils Absolute: 0 10*3/uL (ref 0.0–0.1)
Basophils Relative: 0 % (ref 0–1)
Neutro Abs: 4.1 10*3/uL (ref 1.7–7.7)
Neutrophils Relative %: 51 % (ref 43–77)

## 2012-04-08 LAB — CBC
HCT: 35.3 % — ABNORMAL LOW (ref 36.0–46.0)
MCH: 27.9 pg (ref 26.0–34.0)
MCV: 85.7 fL (ref 78.0–100.0)
RBC: 4.12 MIL/uL (ref 3.87–5.11)
WBC: 8.3 10*3/uL (ref 4.0–10.5)

## 2012-04-08 LAB — COMPREHENSIVE METABOLIC PANEL
BUN: 17 mg/dL (ref 6–23)
CO2: 30 mEq/L (ref 19–32)
Chloride: 101 mEq/L (ref 96–112)
Creatinine, Ser: 0.9 mg/dL (ref 0.50–1.10)
GFR calc Af Amer: 79 mL/min — ABNORMAL LOW (ref >90)
GFR calc non Af Amer: 68 mL/min — ABNORMAL LOW (ref >90)
Total Bilirubin: 0.1 mg/dL — ABNORMAL LOW (ref 0.3–1.2)

## 2012-04-08 NOTE — ED Notes (Signed)
Pt c/o palpitations onset last Saturday.  Pt states they are intermittent in nature.  Pt states they are short in duration " a couple minutes".  Pt denies CP, SOB, N/V/diaphoresis.  Pt also denies weakness.

## 2012-04-08 NOTE — Discharge Instructions (Signed)
Premature Ventricular Contraction  Premature ventricular contraction (PVC) is an irregularity of the heart rhythm involving extra or skipped heartbeats. In some cases, they may occur without obvious cause or heart disease.  Other times, they can be caused by an electrolyte change in the blood. These need to be corrected. They can also be seen when there is not enough oxygen going to the heart. A common cause of this is plaque or cholesterol buildup. This buildup decreases the blood supply to the heart. In addition, extra beats may be caused or aggravated by:   Excessive smoking.   Alcohol consumption.   Caffeine.   Certain medications   Some street drugs.  SYMPTOMS     The sensation of feeling your heart skipping a beat (palpitations).   In many cases, the person may have no symptoms.  SIGNS AND TESTS     A physical examination may show an occasional irregularity, but if the PVC beats do not happen often, they may not be found on physical exam.   Blood pressure is usually normal.   Other tests that may find extra beats of the heart are:   An EKG (electrocardiogram)   A Holter monitor which can monitor your heart over longer periods of time   An Angiogram (study of the heart arteries).  TREATMENT    Usually extra heartbeats do not need treatment. The condition is treated only if symptoms are severe or if extra beats are very frequent or are causing problems. An underlying cause, if discovered, may also require treatment.    Treatment may also be needed if there may be a risk for other more serious cardiac arrhythmias.    PREVENTION     Moderation in caffeine, alcohol, and tobacco use may reduce the risk of ectopic heartbeats in some people.   Exercise often helps people who lead a sedentary (inactive) lifestyle.  PROGNOSIS    PVC heartbeats are generally harmless and do not need treatment.    RISKS AND COMPLICATIONS     Ventricular tachycardia (occasionally).   There usually are no complications.    Other arrhythmias (occasionally).  SEEK IMMEDIATE MEDICAL CARE IF:     You feel palpitations that are frequent or continual.   You develop chest pain or other problems such as shortness of breath, sweating, or nausea and vomiting.   You become light-headed or faint (pass out).   You get worse or do not improve with treatment.  Document Released: 06/26/2004 Document Revised: 10/29/2011 Document Reviewed: 01/06/2008  Exit[employer] Patient Information 2012 [employer].

## 2012-04-08 NOTE — ED Provider Notes (Signed)
History     CSN: [id number]  Arrival date & time 04/08/12  1542   First MD Initiated Contact with Patient 04/08/12 1639      Chief Complaint  Patient presents with  . Irregular Heart Beat    (Consider location/radiation/quality/duration/timing/severity/associated sxs/prior treatment) HPI Patient has noted palpitations since Monday.  Come and go 1-2 x/hour for a minute.  States here has correlated with pvc on monitor.  Not noted at night.  No associated symptoms of sob, chest pain, or light headed.  Intentional weight loss of 2-3 lbs.  Patient worked in yard on Sunday with no problems.  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   Drinks coffee in a.m. And tea.  Not using albuterol but did take sudafed yesterday.   Past Medical History  Diagnosis Date  . Hypertension   . Asthma   . Shortness of breath   . Viral meningitis     Past Surgical History  Procedure Date  . Hiatal hernia repair age 84  . Abdominal hysterectomy   . Tubal ligation   . Maxillary sinus lift   . Breast enhancement surgery   . Cataract extraction w/phaco 10/20/2011    Procedure: CATARACT EXTRACTION PHACO AND INTRAOCULAR LENS PLACEMENT (IOC);  Surgeon: Loraine Leriche T. Nile Riggs;  Location: [facility];  Service: Ophthalmology;  Laterality: Right;  CDE:8.54    Family History  Problem Relation Age of Onset  . Anesthesia problems Neg Hx   . Hypotension Neg Hx   . Malignant hyperthermia Neg Hx   . Pseudochol deficiency Neg Hx     History  Substance Use Topics  . Smoking status: Never Smoker   . Smokeless tobacco: Not on file  . Alcohol Use: 0.6 oz/week    1 Glasses of wine per week     occassional    OB History    Grav Para Term Preterm Abortions TAB SAB Ect Mult Living                  Review of Systems  All other systems reviewed and are negative.    Allergies  Aspirin; Sulfa antibiotics; and Strawberry  Home Medications   Current Outpatient Rx  Name Route Sig Dispense  Refill  . ACETAMINOPHEN 500 MG PO TABS Oral Take 1,000 mg by mouth every 6 (six) hours as needed. For pain     . ALBUTEROL SULFATE HFA 108 (90 BASE) MCG/ACT IN AERS Inhalation Inhale 2 puffs into the lungs every 6 (six) hours as needed. For SOB    . BUDESONIDE-FORMOTEROL FUMARATE 80-4.5 MCG/ACT IN AERO Inhalation Inhale 2 puffs into the lungs every morning.     Marland Kitchen CALCIUM CARBONATE-VITAMIN D 500-200 MG-UNIT PO TABS Oral Take 1 tablet by mouth every evening.     Marland Kitchen LORATADINE 10 MG PO TABS Oral Take 10 mg by mouth every evening.     Marland Kitchen MONTELUKAST SODIUM 10 MG PO TABS Oral Take 10 mg by mouth every evening.     Marland Kitchen OLMESARTAN MEDOXOMIL-HCTZ 20-12.5 MG PO TABS Oral Take 1 tablet by mouth every morning.     Marland Kitchen PSEUDOEPHEDRINE HCL 30 MG PO TABS Oral Take 60 mg by mouth every 4 (four) hours as needed. For congestion    . KETOTIFEN FUMARATE 0.025 % OP SOLN Ophthalmic Apply 1 drop to eye as needed. For dry eyes       BP 125/80  Pulse 83  Temp(Src) 98.7 F (37.1 C) (Oral)  Resp  16  Ht 5' 1.25" (1.556 m)  Wt 135 lb (61.236 kg)  BMI 25.30 kg/m2  SpO2 100%  Physical Exam  Nursing note and vitals reviewed. Constitutional: She appears well-developed and well-nourished.  HENT:  Head: Normocephalic and atraumatic.  Eyes: Conjunctivae and EOM are normal. Pupils are equal, round, and reactive to light.  Neck: Normal range of motion. Neck supple.  Cardiovascular: Normal rate, regular rhythm, normal heart sounds and intact distal pulses.   Pulmonary/Chest: Effort normal and breath sounds normal.  Abdominal: Soft. Bowel sounds are normal.  Musculoskeletal: Normal range of motion.  Neurological: She is alert.  Skin: Skin is warm and dry.  Psychiatric: She has a normal mood and affect. Thought content normal.    ED Course  Procedures (including critical care time)   Labs Reviewed  TROPONIN I  CBC  COMPREHENSIVE METABOLIC PANEL   No results found.     Date: 04/08/2012  Rate: 82  Rhythm:  normal sinus rhythm, pvc  QRS Axis: normal  Intervals: normal  ST/T Wave abnormalities: normal  Conduction Disutrbances:none  Narrative Interpretation:   Old EKG Reviewed: none available   MDM    Patient without complaints of pain or dyspnea. She has PVCs on the monitor and on her EKG. She is advised regarding medication intake and  Caffeine as well as other causes of PVCs. She will follow with her primary care Dr. to have her thyroid checked.     Hilario Quarry, MD 04/08/12 [phone]

## 2012-05-17 ENCOUNTER — Other Ambulatory Visit (HOSPITAL_COMMUNITY): Payer: Self-pay | Admitting: Family Medicine

## 2012-05-17 DIAGNOSIS — Z1231 Encounter for screening mammogram for malignant neoplasm of breast: Secondary | ICD-10-CM

## 2012-06-14 ENCOUNTER — Ambulatory Visit (HOSPITAL_COMMUNITY)
Admission: RE | Admit: 2012-06-14 | Discharge: 2012-06-14 | Disposition: A | Discharge status: Home / Self Care | Payer: 59 | Source: Ambulatory Visit | Attending: Family Medicine | Admitting: Family Medicine

## 2012-06-14 DIAGNOSIS — Z1231 Encounter for screening mammogram for malignant neoplasm of breast: Secondary | ICD-10-CM | POA: Insufficient documentation

## 2012-06-16 ENCOUNTER — Other Ambulatory Visit: Payer: Self-pay | Admitting: Family Medicine

## 2012-06-16 DIAGNOSIS — R928 Other abnormal and inconclusive findings on diagnostic imaging of breast: Secondary | ICD-10-CM

## 2012-06-21 ENCOUNTER — Ambulatory Visit
Admission: RE | Admit: 2012-06-21 | Discharge: 2012-06-21 | Disposition: A | Discharge status: Home / Self Care | Payer: 59 | Source: Ambulatory Visit | Attending: Family Medicine | Admitting: Family Medicine

## 2012-06-21 DIAGNOSIS — R928 Other abnormal and inconclusive findings on diagnostic imaging of breast: Secondary | ICD-10-CM

## 2012-08-22 ENCOUNTER — Other Ambulatory Visit: Payer: Self-pay | Admitting: Family Medicine

## 2012-08-22 ENCOUNTER — Other Ambulatory Visit (HOSPITAL_COMMUNITY)
Admission: RE | Admit: 2012-08-22 | Discharge: 2012-08-22 | Disposition: A | Discharge status: Home / Self Care | Payer: 59 | Source: Ambulatory Visit | Attending: Family Medicine | Admitting: Family Medicine

## 2012-08-22 DIAGNOSIS — Z01419 Encounter for gynecological examination (general) (routine) without abnormal findings: Secondary | ICD-10-CM | POA: Insufficient documentation

## 2012-12-21 ENCOUNTER — Encounter: Payer: Self-pay | Admitting: Internal Medicine

## 2013-03-08 HISTORY — PX: CATARACT EXTRACTION: SUR2

## 2013-04-18 ENCOUNTER — Encounter: Payer: Self-pay | Admitting: Internal Medicine

## 2013-05-09 ENCOUNTER — Other Ambulatory Visit (HOSPITAL_COMMUNITY): Payer: Self-pay | Admitting: Family Medicine

## 2013-05-09 DIAGNOSIS — Z1231 Encounter for screening mammogram for malignant neoplasm of breast: Secondary | ICD-10-CM

## 2013-06-02 ENCOUNTER — Ambulatory Visit (AMBULATORY_SURGERY_CENTER): Payer: 59 | Admitting: *Deleted

## 2013-06-02 VITALS — Ht 61.25 in | Wt 132.8 lb

## 2013-06-02 DIAGNOSIS — Z86010 Personal history of colonic polyps: Secondary | ICD-10-CM

## 2013-06-02 MED ORDER — MOVIPREP 100 G PO SOLR: 1.0000 | Freq: Once | ORAL | Status: DC

## 2013-06-02 NOTE — Progress Notes (Signed)
No egg or soy allergy. No anesthesia problems.  

## 2013-06-16 ENCOUNTER — Encounter: Payer: 59 | Admitting: Internal Medicine

## 2013-06-16 ENCOUNTER — Ambulatory Visit (HOSPITAL_COMMUNITY)
Admission: RE | Admit: 2013-06-16 | Discharge: 2013-06-16 | Disposition: A | Discharge status: Home / Self Care | Payer: 59 | Source: Ambulatory Visit | Attending: Family Medicine | Admitting: Family Medicine

## 2013-06-16 DIAGNOSIS — Z1231 Encounter for screening mammogram for malignant neoplasm of breast: Secondary | ICD-10-CM | POA: Insufficient documentation

## 2013-06-23 ENCOUNTER — Encounter: Payer: Self-pay | Admitting: Internal Medicine

## 2013-06-23 ENCOUNTER — Ambulatory Visit (AMBULATORY_SURGERY_CENTER): Payer: 59 | Admitting: Internal Medicine

## 2013-06-23 VITALS — BP 132/82 | HR 92 | Temp 98.4°F | Resp 20 | Ht 61.5 in | Wt 132.0 lb

## 2013-06-23 DIAGNOSIS — Z860100 Personal history of colon polyps, unspecified: Secondary | ICD-10-CM

## 2013-06-23 DIAGNOSIS — D126 Benign neoplasm of colon, unspecified: Secondary | ICD-10-CM

## 2013-06-23 MED ORDER — SODIUM CHLORIDE 0.9 % IV SOLN: 500.0000 mL | INTRAVENOUS | Status: DC

## 2013-06-23 NOTE — Op Note (Signed)
[facility] [address] [place], [address]   COLONOSCOPY PROCEDURE REPORT  PATIENT: Alexis Guerrero, Alexis Guerrero  MR#: [id number] BIRTHDATE: Mar 26, 1951 , 61  yrs. old GENDER: Female ENDOSCOPIST: Hart Carwin, MD REFERRED [id number] Zachery Dauer, M.D. PROCEDURE DATE:  06/23/2013 PROCEDURE:   Colonoscopy with cold biopsy polypectomy First Screening Colonoscopy - Avg.  risk and is 50 yrs.  old or older - No.  Prior Negative Screening - Now for repeat screening. N/A  History of Adenoma - Now for follow-up colonoscopy & has been > or = to 3 yrs.  Yes hx of adenoma.  Has been 3 or more years since last colonoscopy.  Polyps Removed Today? Yes. ASA CLASS:   Class II INDICATIONS:, adenomatous polyp in 2009,, parent with colon cancer ,first colonoscopy in 2004, and Patient's immediate family history of colon cancer. age 80 MEDICATIONS: MAC sedation, administered by CRNA and propofol (Diprivan) 300mg  IV  DESCRIPTION OF PROCEDURE:   After the risks benefits and alternatives of the procedure were thoroughly explained, informed consent was obtained.  A digital rectal exam revealed no abnormalities of the rectum.   The LB [id number]-[id number]  endoscope was introduced through the anus and advanced to the cecum, which was identified by both the appendix and ileocecal valve. No adverse events experienced.   The quality of the prep was excellent, using MoviPrep  The instrument was then slowly withdrawn as the colon was fully examined.      COLON FINDINGS: A diminutive sessile polyp was found in the sigmoid colon.  A polypectomy was performed with cold forceps.  The resection was complete and the polyp tissue was completely retrieved. There was mild diverticulosis of the sigmoid colon Retroflexed views revealed no abnormalities. The time to cecum=  . Withdrawal time=  .  The scope was withdrawn and the procedure completed. COMPLICATIONS: There were no complications.  ENDOSCOPIC  IMPRESSION: Diminutive sessile polyp was found in the sigmoid colon at 30 cm, ; polypectomy was performed with cold forceps  RECOMMENDATIONS: 1.  Await pathology results 2.  High fiber diet   eSigned:  Hart Carwin, MD 06/23/2013 9:05 AM   cc:   PATIENT NAME:  Alexis Guerrero, Alexis Guerrero MR#: [id number]

## 2013-06-23 NOTE — Progress Notes (Signed)
Patient did not experience any of the following events: a burn prior to discharge; a fall within the facility; wrong site/side/patient/procedure/implant event; or a hospital transfer or hospital admission upon discharge from the facility. (G8907) Patient did not have preoperative order for IV antibiotic SSI prophylaxis. (G8918)  

## 2013-06-23 NOTE — Patient Instructions (Addendum)

## 2013-06-23 NOTE — Progress Notes (Signed)
Called to room to assist during endoscopic procedure.  Patient ID and intended procedure confirmed with present staff. Received instructions for my participation in the procedure from the performing physician.  

## 2013-06-23 NOTE — Progress Notes (Signed)
Report to pacu rn, vss, bbs=clear 

## 2013-06-26 ENCOUNTER — Telehealth: Payer: Self-pay | Admitting: *Deleted

## 2013-06-26 NOTE — Telephone Encounter (Signed)
  Follow up Call-  Call back number 06/23/2013  Post procedure Call Back phone  # [phone]  Permission to leave phone message Yes     Patient questions:  Message left to call us if necessary.

## 2013-06-28 ENCOUNTER — Encounter: Payer: Self-pay | Admitting: Internal Medicine

## 2013-10-20 ENCOUNTER — Encounter: Payer: Self-pay | Admitting: Internal Medicine

## 2013-11-10 ENCOUNTER — Other Ambulatory Visit (HOSPITAL_COMMUNITY): Payer: Self-pay | Admitting: Family Medicine

## 2013-11-10 DIAGNOSIS — E2839 Other primary ovarian failure: Secondary | ICD-10-CM

## 2013-12-22 ENCOUNTER — Ambulatory Visit (HOSPITAL_COMMUNITY)
Admission: RE | Admit: 2013-12-22 | Discharge: 2013-12-22 | Disposition: A | Discharge status: Home / Self Care | Payer: 59 | Source: Ambulatory Visit | Attending: Family Medicine | Admitting: Family Medicine

## 2013-12-22 ENCOUNTER — Ambulatory Visit (HOSPITAL_COMMUNITY): Payer: 59

## 2013-12-22 DIAGNOSIS — Z780 Asymptomatic menopausal state: Secondary | ICD-10-CM | POA: Insufficient documentation

## 2013-12-22 DIAGNOSIS — E2839 Other primary ovarian failure: Secondary | ICD-10-CM

## 2013-12-22 DIAGNOSIS — Z13820 Encounter for screening for osteoporosis: Secondary | ICD-10-CM | POA: Insufficient documentation

## 2014-06-07 ENCOUNTER — Other Ambulatory Visit (HOSPITAL_COMMUNITY): Payer: Self-pay | Admitting: Family Medicine

## 2014-06-07 DIAGNOSIS — Z1231 Encounter for screening mammogram for malignant neoplasm of breast: Secondary | ICD-10-CM

## 2014-06-20 ENCOUNTER — Ambulatory Visit (HOSPITAL_COMMUNITY)
Admission: RE | Admit: 2014-06-20 | Discharge: 2014-06-20 | Disposition: A | Discharge status: Home / Self Care | Payer: 59 | Source: Ambulatory Visit | Attending: Family Medicine | Admitting: Family Medicine

## 2014-06-20 DIAGNOSIS — Z1231 Encounter for screening mammogram for malignant neoplasm of breast: Secondary | ICD-10-CM | POA: Insufficient documentation

## 2014-06-22 ENCOUNTER — Ambulatory Visit (HOSPITAL_COMMUNITY): Payer: 59

## 2015-01-30 ENCOUNTER — Other Ambulatory Visit: Payer: Self-pay

## 2015-05-23 ENCOUNTER — Other Ambulatory Visit (HOSPITAL_COMMUNITY): Payer: Self-pay | Admitting: Family Medicine

## 2015-05-23 DIAGNOSIS — Z1231 Encounter for screening mammogram for malignant neoplasm of breast: Secondary | ICD-10-CM

## 2015-06-25 ENCOUNTER — Ambulatory Visit (HOSPITAL_COMMUNITY)
Admission: RE | Admit: 2015-06-25 | Discharge: 2015-06-25 | Disposition: A | Discharge status: Home / Self Care | Payer: 59 | Source: Ambulatory Visit | Attending: Family Medicine | Admitting: Family Medicine

## 2015-06-25 DIAGNOSIS — Z1231 Encounter for screening mammogram for malignant neoplasm of breast: Secondary | ICD-10-CM | POA: Diagnosis present

## 2015-11-26 MED FILL — CYCLOBENZAPRINE 10 MG TAB: 10 | 30 days supply | Qty: 90 | Fill #0

## 2015-11-27 MED FILL — FLUTICASONE PROP 50 MCG SPR: 50 | 30 days supply | Qty: 16 | Fill #1

## 2015-12-07 DIAGNOSIS — J069 Acute upper respiratory infection, unspecified: Secondary | ICD-10-CM | POA: Diagnosis not present

## 2015-12-07 DIAGNOSIS — R0789 Other chest pain: Secondary | ICD-10-CM | POA: Diagnosis not present

## 2015-12-23 MED FILL — ZOLPIDEM TARTRATE 5 MG TAB: 5 | 30 days supply | Qty: 30 | Fill #0

## 2015-12-25 DIAGNOSIS — R3915 Urgency of urination: Secondary | ICD-10-CM | POA: Diagnosis not present

## 2015-12-25 DIAGNOSIS — Z0000 Encounter for general adult medical examination without abnormal findings: Secondary | ICD-10-CM | POA: Diagnosis not present

## 2015-12-25 DIAGNOSIS — M8588 Other specified disorders of bone density and structure, other site: Secondary | ICD-10-CM | POA: Diagnosis not present

## 2015-12-25 DIAGNOSIS — E559 Vitamin D deficiency, unspecified: Secondary | ICD-10-CM | POA: Diagnosis not present

## 2015-12-25 DIAGNOSIS — I10 Essential (primary) hypertension: Secondary | ICD-10-CM | POA: Diagnosis not present

## 2015-12-25 DIAGNOSIS — Z13220 Encounter for screening for lipoid disorders: Secondary | ICD-10-CM | POA: Diagnosis not present

## 2015-12-25 DIAGNOSIS — Z87448 Personal history of other diseases of urinary system: Secondary | ICD-10-CM | POA: Diagnosis not present

## 2015-12-25 DIAGNOSIS — G479 Sleep disorder, unspecified: Secondary | ICD-10-CM | POA: Diagnosis not present

## 2015-12-25 MED FILL — TOLTERODINE TARTRATE 2 MG T: 2 | 30 days supply | Qty: 60 | Fill #0

## 2015-12-26 MED FILL — FLUTICASONE PROP 50 MCG SPR: 50 | 30 days supply | Qty: 16 | Fill #2

## 2015-12-27 ENCOUNTER — Other Ambulatory Visit: Payer: Self-pay | Admitting: Family Medicine

## 2015-12-27 DIAGNOSIS — M8580 Other specified disorders of bone density and structure, unspecified site: Secondary | ICD-10-CM

## 2016-01-15 MED FILL — OLMESARTAN-HCTZ 20-12.5 MG: 20-12.5 | 90 days supply | Qty: 90 | Fill #1

## 2016-01-16 MED FILL — VENTOLIN HFA 90 MCG INHALER: 108 (90 BAS | 16 days supply | Qty: 18 | Fill #1

## 2016-01-16 MED FILL — SYMBICORT 80-4.5 MCG INH: 80-4.5 | 30 days supply | Qty: 10 | Fill #0

## 2016-01-22 ENCOUNTER — Ambulatory Visit
Admission: RE | Admit: 2016-01-22 | Discharge: 2016-01-22 | Disposition: A | Discharge status: Home / Self Care | Payer: 59 | Source: Ambulatory Visit | Attending: Family Medicine | Admitting: Family Medicine

## 2016-01-22 DIAGNOSIS — M85851 Other specified disorders of bone density and structure, right thigh: Secondary | ICD-10-CM | POA: Diagnosis not present

## 2016-01-22 DIAGNOSIS — M8580 Other specified disorders of bone density and structure, unspecified site: Secondary | ICD-10-CM

## 2016-02-07 MED FILL — FLUTICASONE PROP 50 MCG SPR: 50 | 30 days supply | Qty: 16 | Fill #3

## 2016-03-02 MED FILL — TOLTERODINE TARTRATE 2 MG T: 2 | 30 days supply | Qty: 60 | Fill #0

## 2016-03-12 MED FILL — FLUTICASONE PROP 50 MCG SPR: 50 | 30 days supply | Qty: 16 | Fill #4

## 2016-04-10 MED FILL — FLUTICASONE PROP 50 MCG SPR: 50 | 30 days supply | Qty: 16 | Fill #5

## 2016-04-13 MED FILL — OLMESARTAN-HCTZ 20-12.5 MG: 20-12.5 | 90 days supply | Qty: 90 | Fill #0

## 2016-05-04 MED FILL — TOLTERODINE TARTRATE 2 MG T: 2 | 30 days supply | Qty: 60 | Fill #1

## 2016-05-11 DIAGNOSIS — K6289 Other specified diseases of anus and rectum: Secondary | ICD-10-CM | POA: Diagnosis not present

## 2016-05-19 MED FILL — LIDOCAINE HCL 2% JELLY: 2 | 15 days supply | Qty: 90 | Fill #0

## 2016-05-21 MED FILL — SYMBICORT 80-4.5 MCG INH: 80-4.5 | 30 days supply | Qty: 10 | Fill #1

## 2016-05-21 MED FILL — FLUTICASONE PROP 50 MCG SPR: 50 | 30 days supply | Qty: 16 | Fill #0

## 2016-06-17 MED FILL — SYMBICORT 80-4.5 MCG INH: 80-4.5 | 30 days supply | Qty: 10 | Fill #2

## 2016-06-17 MED FILL — FLUTICASONE PROP 50 MCG SPR: 50 | 30 days supply | Qty: 16 | Fill #0

## 2016-06-18 ENCOUNTER — Other Ambulatory Visit: Payer: Self-pay | Admitting: Family Medicine

## 2016-06-18 DIAGNOSIS — Z1231 Encounter for screening mammogram for malignant neoplasm of breast: Secondary | ICD-10-CM

## 2016-06-22 MED FILL — TOLTERODINE TARTRATE 2 MG T: 2 | 30 days supply | Qty: 60 | Fill #2

## 2016-06-25 DIAGNOSIS — H26493 Other secondary cataract, bilateral: Secondary | ICD-10-CM | POA: Diagnosis not present

## 2016-06-25 DIAGNOSIS — H43391 Other vitreous opacities, right eye: Secondary | ICD-10-CM | POA: Diagnosis not present

## 2016-06-26 ENCOUNTER — Ambulatory Visit: Payer: 59

## 2016-07-02 ENCOUNTER — Ambulatory Visit
Admission: RE | Admit: 2016-07-02 | Discharge: 2016-07-02 | Disposition: A | Discharge status: Home / Self Care | Payer: 59 | Source: Ambulatory Visit | Attending: Family Medicine | Admitting: Family Medicine

## 2016-07-02 DIAGNOSIS — Z1231 Encounter for screening mammogram for malignant neoplasm of breast: Secondary | ICD-10-CM

## 2016-07-20 MED FILL — SYMBICORT 80-4.5 MCG INH: 80-4.5 | 30 days supply | Qty: 10 | Fill #3

## 2016-07-20 MED FILL — OLMESARTAN-HCTZ 20-12.5 MG: 20-12.5 | 90 days supply | Qty: 90 | Fill #1

## 2016-07-20 MED FILL — TOLTERODINE TARTRATE 2 MG T: 2 | 30 days supply | Qty: 60 | Fill #3

## 2016-07-20 MED FILL — FLUTICASONE PROP 50 MCG SPR: 50 | 30 days supply | Qty: 16 | Fill #1

## 2016-07-20 MED FILL — CYCLOBENZAPRINE 10 MG TAB: 10 | 30 days supply | Qty: 90 | Fill #0

## 2016-08-07 DIAGNOSIS — Z23 Encounter for immunization: Secondary | ICD-10-CM | POA: Diagnosis not present

## 2016-08-14 DIAGNOSIS — J302 Other seasonal allergic rhinitis: Secondary | ICD-10-CM | POA: Diagnosis not present

## 2016-08-14 DIAGNOSIS — H6990 Unspecified Eustachian tube disorder, unspecified ear: Secondary | ICD-10-CM | POA: Diagnosis not present

## 2016-08-19 MED FILL — TOLTERODINE TARTRATE 2 MG T: 2 | 30 days supply | Qty: 60 | Fill #4

## 2016-08-19 MED FILL — FLUTICASONE PROP 50 MCG SPR: 50 | 30 days supply | Qty: 16 | Fill #2

## 2016-09-14 DIAGNOSIS — H1045 Other chronic allergic conjunctivitis: Secondary | ICD-10-CM | POA: Diagnosis not present

## 2016-09-14 DIAGNOSIS — J4530 Mild persistent asthma, uncomplicated: Secondary | ICD-10-CM | POA: Diagnosis not present

## 2016-09-14 DIAGNOSIS — J300 Vasomotor rhinitis: Secondary | ICD-10-CM | POA: Diagnosis not present

## 2016-09-14 MED FILL — AZELASTINE HCL 137 MCG SPRY: 0.1 | 30 days supply | Qty: 30 | Fill #0

## 2016-09-14 MED FILL — AZELASTINE HCL 0.05% DROPS: 0.05 | 30 days supply | Qty: 6 | Fill #0

## 2016-09-14 MED FILL — MONTELUKAST SOD 10 MG TAB: 10 | 30 days supply | Qty: 30 | Fill #0

## 2016-09-24 MED FILL — ZOLPIDEM TARTRATE 5 MG TAB: 5 | 30 days supply | Qty: 30 | Fill #0

## 2016-10-01 MED FILL — FLUTICASONE PROP 50 MCG SPR: 50 | 30 days supply | Qty: 16 | Fill #3

## 2016-10-12 MED FILL — OLMESARTAN-HCTZ 20-12.5 MG: 20-12.5 | 90 days supply | Qty: 90 | Fill #2

## 2016-10-19 MED FILL — AZELASTINE HCL 0.05% DROPS: 0.05 | 30 days supply | Qty: 6 | Fill #1

## 2016-10-19 MED FILL — MONTELUKAST SOD 10 MG TAB: 10 | 30 days supply | Qty: 30 | Fill #1

## 2016-11-03 MED FILL — FLUTICASONE PROP 50 MCG SPR: 50 | 30 days supply | Qty: 16 | Fill #4

## 2016-11-28 DIAGNOSIS — R109 Unspecified abdominal pain: Secondary | ICD-10-CM | POA: Diagnosis not present

## 2016-11-28 DIAGNOSIS — N390 Urinary tract infection, site not specified: Secondary | ICD-10-CM | POA: Diagnosis not present

## 2016-11-28 DIAGNOSIS — R05 Cough: Secondary | ICD-10-CM | POA: Diagnosis not present

## 2016-11-28 DIAGNOSIS — J111 Influenza due to unidentified influenza virus with other respiratory manifestations: Secondary | ICD-10-CM | POA: Diagnosis not present

## 2016-12-02 MED FILL — FLUTICASONE PROP 50 MCG SPR: 50 | 30 days supply | Qty: 16 | Fill #5

## 2016-12-02 MED FILL — AZELASTINE HCL 0.05% DROPS: 0.05 | 30 days supply | Qty: 6 | Fill #2

## 2016-12-04 DIAGNOSIS — N390 Urinary tract infection, site not specified: Secondary | ICD-10-CM | POA: Diagnosis not present

## 2016-12-04 DIAGNOSIS — B349 Viral infection, unspecified: Secondary | ICD-10-CM | POA: Diagnosis not present

## 2016-12-04 DIAGNOSIS — R8290 Unspecified abnormal findings in urine: Secondary | ICD-10-CM | POA: Diagnosis not present

## 2016-12-11 MED FILL — MONTELUKAST SOD 10 MG TAB: 10 | 30 days supply | Qty: 30 | Fill #2

## 2016-12-22 DIAGNOSIS — E559 Vitamin D deficiency, unspecified: Secondary | ICD-10-CM | POA: Diagnosis not present

## 2016-12-22 DIAGNOSIS — I10 Essential (primary) hypertension: Secondary | ICD-10-CM | POA: Diagnosis not present

## 2016-12-22 DIAGNOSIS — Z0000 Encounter for general adult medical examination without abnormal findings: Secondary | ICD-10-CM | POA: Diagnosis not present

## 2016-12-22 DIAGNOSIS — Z136 Encounter for screening for cardiovascular disorders: Secondary | ICD-10-CM | POA: Diagnosis not present

## 2016-12-28 DIAGNOSIS — Z1389 Encounter for screening for other disorder: Secondary | ICD-10-CM | POA: Diagnosis not present

## 2016-12-28 DIAGNOSIS — J302 Other seasonal allergic rhinitis: Secondary | ICD-10-CM | POA: Diagnosis not present

## 2016-12-28 DIAGNOSIS — G479 Sleep disorder, unspecified: Secondary | ICD-10-CM | POA: Diagnosis not present

## 2016-12-28 DIAGNOSIS — Z0000 Encounter for general adult medical examination without abnormal findings: Secondary | ICD-10-CM | POA: Diagnosis not present

## 2016-12-28 DIAGNOSIS — M8588 Other specified disorders of bone density and structure, other site: Secondary | ICD-10-CM | POA: Diagnosis not present

## 2016-12-28 DIAGNOSIS — Z23 Encounter for immunization: Secondary | ICD-10-CM | POA: Diagnosis not present

## 2016-12-28 DIAGNOSIS — J45909 Unspecified asthma, uncomplicated: Secondary | ICD-10-CM | POA: Diagnosis not present

## 2016-12-28 MED FILL — SYMBICORT 80-4.5 MCG INH: 80-4.5 | 30 days supply | Qty: 10 | Fill #0

## 2016-12-28 MED FILL — TOLTERODINE TARTRATE 2 MG T: 2 | 30 days supply | Qty: 60 | Fill #0

## 2016-12-28 MED FILL — FLUTICASONE PROP 50 MCG SPR: 50 | 30 days supply | Qty: 16 | Fill #0

## 2016-12-28 MED FILL — PROAIR HFA 90 MCG INHALER: 108 (90 BAS | 17 days supply | Qty: 9 | Fill #0

## 2016-12-28 MED FILL — CYCLOBENZAPRINE 10 MG TAB: 10 | 10 days supply | Qty: 30 | Fill #0

## 2017-01-18 MED FILL — OLMESARTAN-HCTZ 20-12.5 MG: 20-12.5 | 90 days supply | Qty: 90 | Fill #0

## 2017-01-20 MED FILL — MONTELUKAST SOD 10 MG TAB: 10 | 30 days supply | Qty: 30 | Fill #3

## 2017-01-27 MED FILL — ZOLPIDEM TARTRATE 5 MG TAB: 5 | 30 days supply | Qty: 30 | Fill #0

## 2017-02-17 MED FILL — FLUTICASONE PROP 50 MCG SPR: 50 | 30 days supply | Qty: 16 | Fill #1

## 2017-02-17 MED FILL — MONTELUKAST SOD 10 MG TAB: 10 | 30 days supply | Qty: 30 | Fill #0

## 2017-03-18 MED FILL — MONTELUKAST SOD 10 MG TAB: 10 | 30 days supply | Qty: 30 | Fill #1

## 2017-03-22 MED FILL — FLUTICASONE PROP 50 MCG SPR: 50 | 30 days supply | Qty: 16 | Fill #2

## 2017-03-24 DIAGNOSIS — J4530 Mild persistent asthma, uncomplicated: Secondary | ICD-10-CM | POA: Diagnosis not present

## 2017-03-24 DIAGNOSIS — J329 Chronic sinusitis, unspecified: Secondary | ICD-10-CM | POA: Diagnosis not present

## 2017-03-24 DIAGNOSIS — J300 Vasomotor rhinitis: Secondary | ICD-10-CM | POA: Diagnosis not present

## 2017-03-24 DIAGNOSIS — H1045 Other chronic allergic conjunctivitis: Secondary | ICD-10-CM | POA: Diagnosis not present

## 2017-03-24 MED FILL — levoFLOXacin 500 MG TABS: 500 | 10 days supply | Qty: 10 | Fill #0

## 2017-04-20 MED FILL — FLUTICASONE PROP 50 MCG SPR: 50 | 30 days supply | Qty: 16 | Fill #0

## 2017-04-20 MED FILL — OLMESARTAN-HCTZ 20-12.5 MG: 20-12.5 | 90 days supply | Qty: 90 | Fill #1

## 2017-04-20 MED FILL — MONTELUKAST SOD 10 MG TAB: 10 | 30 days supply | Qty: 30 | Fill #2

## 2017-04-20 MED FILL — TOLTERODINE TARTRATE 2 MG T: 2 | 30 days supply | Qty: 60 | Fill #1

## 2017-05-05 MED FILL — AZELASTINE HCL 0.05% DROPS: 0.05 | 30 days supply | Qty: 6 | Fill #3

## 2017-05-21 ENCOUNTER — Other Ambulatory Visit: Payer: Self-pay | Admitting: Family Medicine

## 2017-05-21 DIAGNOSIS — Z1231 Encounter for screening mammogram for malignant neoplasm of breast: Secondary | ICD-10-CM

## 2017-05-24 MED FILL — MONTELUKAST SOD 10 MG TAB: 10 | 30 days supply | Qty: 30 | Fill #0

## 2017-05-31 MED FILL — FLUTICASONE PROP 50 MCG SPR: 50 | 30 days supply | Qty: 16 | Fill #1

## 2017-06-21 MED FILL — MONTELUKAST SOD 10 MG TAB: 10 | 30 days supply | Qty: 30 | Fill #1

## 2017-06-21 MED FILL — TOLTERODINE TARTRATE 2 MG T: 2 | 30 days supply | Qty: 60 | Fill #0

## 2017-07-06 ENCOUNTER — Ambulatory Visit
Admission: RE | Admit: 2017-07-06 | Discharge: 2017-07-06 | Disposition: A | Discharge status: Home / Self Care | Payer: PPO | Source: Ambulatory Visit | Attending: Family Medicine | Admitting: Family Medicine

## 2017-07-06 DIAGNOSIS — Z1231 Encounter for screening mammogram for malignant neoplasm of breast: Secondary | ICD-10-CM | POA: Diagnosis not present

## 2017-07-06 MED FILL — FLUTICASONE PROP 50 MCG SPR: 50 | 30 days supply | Qty: 16 | Fill #2

## 2017-07-08 ENCOUNTER — Other Ambulatory Visit: Payer: Self-pay | Admitting: Family Medicine

## 2017-07-08 DIAGNOSIS — R928 Other abnormal and inconclusive findings on diagnostic imaging of breast: Secondary | ICD-10-CM

## 2017-07-12 ENCOUNTER — Ambulatory Visit: Payer: PPO

## 2017-07-12 ENCOUNTER — Ambulatory Visit
Admission: RE | Admit: 2017-07-12 | Discharge: 2017-07-12 | Disposition: A | Discharge status: Home / Self Care | Payer: PPO | Source: Ambulatory Visit | Attending: Family Medicine | Admitting: Family Medicine

## 2017-07-12 DIAGNOSIS — R928 Other abnormal and inconclusive findings on diagnostic imaging of breast: Secondary | ICD-10-CM

## 2017-07-16 MED FILL — ZOLPIDEM TARTRATE 5 MG TAB: 5 | 30 days supply | Qty: 30 | Fill #0

## 2017-07-19 MED FILL — OLMESARTAN-HCTZ 20-12.5 MG: 20-12.5 | 90 days supply | Qty: 90 | Fill #0

## 2017-07-19 MED FILL — AZELASTINE HCL 0.05% DROPS: 0.05 | 30 days supply | Qty: 6 | Fill #0

## 2017-07-19 MED FILL — MONTELUKAST SOD 10 MG TAB: 10 | 30 days supply | Qty: 30 | Fill #2

## 2017-08-06 MED FILL — FLUTICASONE PROP 50 MCG SPR: 50 | 30 days supply | Qty: 16 | Fill #0

## 2017-08-09 MED FILL — TOLTERODINE TARTRATE 2 MG T: 2 | 30 days supply | Qty: 60 | Fill #1

## 2017-08-09 MED FILL — SYMBICORT 80-4.5 MCG INH: 80-4.5 | 30 days supply | Qty: 10 | Fill #1

## 2017-08-10 DIAGNOSIS — M25511 Pain in right shoulder: Secondary | ICD-10-CM | POA: Diagnosis not present

## 2017-08-10 DIAGNOSIS — M25512 Pain in left shoulder: Secondary | ICD-10-CM | POA: Diagnosis not present

## 2017-08-23 MED FILL — MONTELUKAST SOD 10 MG TAB: 10 | 30 days supply | Qty: 30 | Fill #3

## 2017-09-13 DIAGNOSIS — M7581 Other shoulder lesions, right shoulder: Secondary | ICD-10-CM | POA: Diagnosis not present

## 2017-09-13 DIAGNOSIS — R3915 Urgency of urination: Secondary | ICD-10-CM | POA: Diagnosis not present

## 2017-09-13 DIAGNOSIS — R6883 Chills (without fever): Secondary | ICD-10-CM | POA: Diagnosis not present

## 2017-09-13 MED FILL — CIPROFLOXACIN HCL 500 MG TA: 500 | 10 days supply | Qty: 20 | Fill #0

## 2017-09-13 MED FILL — CELECOXIB 200 MG CAPS: 200 | 30 days supply | Qty: 30 | Fill #0

## 2017-09-14 MED FILL — FLUTICASONE PROP 50 MCG SPR: 50 | 30 days supply | Qty: 16 | Fill #1

## 2017-09-30 MED FILL — MONTELUKAST SOD 10 MG TAB: 10 | 30 days supply | Qty: 30 | Fill #4

## 2017-10-12 MED FILL — OLMESARTAN-HCTZ 20-12.5 MG: 20-12.5 | 90 days supply | Qty: 90 | Fill #1

## 2017-10-12 MED FILL — FLUTICASONE PROP 50 MCG SPR: 50 | 30 days supply | Qty: 16 | Fill #2

## 2017-10-12 MED FILL — TOLTERODINE TARTRATE 2 MG T: 2 | 30 days supply | Qty: 60 | Fill #2

## 2017-10-13 DIAGNOSIS — J300 Vasomotor rhinitis: Secondary | ICD-10-CM | POA: Diagnosis not present

## 2017-10-13 DIAGNOSIS — J4530 Mild persistent asthma, uncomplicated: Secondary | ICD-10-CM | POA: Diagnosis not present

## 2017-10-13 DIAGNOSIS — H1045 Other chronic allergic conjunctivitis: Secondary | ICD-10-CM | POA: Diagnosis not present

## 2017-10-27 MED FILL — MONTELUKAST SOD 10 MG TAB: 10 | 30 days supply | Qty: 30 | Fill #5

## 2017-11-22 MED FILL — MONTELUKAST SOD 10 MG TAB: 10 | 30 days supply | Qty: 30 | Fill #6

## 2017-11-24 MED FILL — FLUTICASONE PROP 50 MCG SPR: 50 | 30 days supply | Qty: 16 | Fill #0

## 2017-12-17 DIAGNOSIS — Z0100 Encounter for examination of eyes and vision without abnormal findings: Secondary | ICD-10-CM | POA: Diagnosis not present

## 2017-12-22 MED FILL — FLUTICASONE PROP 50 MCG SPR: 50 | 30 days supply | Qty: 16 | Fill #1

## 2017-12-27 MED FILL — MONTELUKAST SOD 10 MG TAB: 10 | 90 days supply | Qty: 90 | Fill #0

## 2017-12-31 DIAGNOSIS — G479 Sleep disorder, unspecified: Secondary | ICD-10-CM | POA: Diagnosis not present

## 2017-12-31 DIAGNOSIS — I10 Essential (primary) hypertension: Secondary | ICD-10-CM | POA: Diagnosis not present

## 2017-12-31 DIAGNOSIS — Z79899 Other long term (current) drug therapy: Secondary | ICD-10-CM | POA: Diagnosis not present

## 2017-12-31 DIAGNOSIS — J45909 Unspecified asthma, uncomplicated: Secondary | ICD-10-CM | POA: Diagnosis not present

## 2017-12-31 DIAGNOSIS — Z0000 Encounter for general adult medical examination without abnormal findings: Secondary | ICD-10-CM | POA: Diagnosis not present

## 2017-12-31 DIAGNOSIS — R61 Generalized hyperhidrosis: Secondary | ICD-10-CM | POA: Diagnosis not present

## 2017-12-31 DIAGNOSIS — M8588 Other specified disorders of bone density and structure, other site: Secondary | ICD-10-CM | POA: Diagnosis not present

## 2017-12-31 DIAGNOSIS — E559 Vitamin D deficiency, unspecified: Secondary | ICD-10-CM | POA: Diagnosis not present

## 2017-12-31 DIAGNOSIS — Z1389 Encounter for screening for other disorder: Secondary | ICD-10-CM | POA: Diagnosis not present

## 2017-12-31 DIAGNOSIS — Z23 Encounter for immunization: Secondary | ICD-10-CM | POA: Diagnosis not present

## 2017-12-31 MED FILL — OLMESARTAN-HCTZ 20-12.5 MG: 20-12.5 | 90 days supply | Qty: 90 | Fill #0

## 2017-12-31 MED FILL — CYCLOBENZAPRINE 10 MG TAB: 10 | 10 days supply | Qty: 30 | Fill #0

## 2017-12-31 MED FILL — TOLTERODINE TART ER 4 MG CA: 4 | 90 days supply | Qty: 90 | Fill #0

## 2017-12-31 MED FILL — ZOLPIDEM TARTRATE 5 MG TAB: 5 | 30 days supply | Qty: 30 | Fill #0

## 2018-01-04 ENCOUNTER — Other Ambulatory Visit: Payer: Self-pay | Admitting: Family Medicine

## 2018-01-04 DIAGNOSIS — M8580 Other specified disorders of bone density and structure, unspecified site: Secondary | ICD-10-CM

## 2018-01-10 DIAGNOSIS — M25511 Pain in right shoulder: Secondary | ICD-10-CM | POA: Diagnosis not present

## 2018-01-10 DIAGNOSIS — M67911 Unspecified disorder of synovium and tendon, right shoulder: Secondary | ICD-10-CM | POA: Diagnosis not present

## 2018-01-10 DIAGNOSIS — M75111 Incomplete rotator cuff tear or rupture of right shoulder, not specified as traumatic: Secondary | ICD-10-CM | POA: Diagnosis not present

## 2018-01-10 DIAGNOSIS — M25562 Pain in left knee: Secondary | ICD-10-CM | POA: Diagnosis not present

## 2018-01-13 DIAGNOSIS — L309 Dermatitis, unspecified: Secondary | ICD-10-CM | POA: Diagnosis not present

## 2018-01-13 MED FILL — HYDROCORTISONE 2.5% OINT: 2.5 | 7 days supply | Qty: 28 | Fill #0

## 2018-01-19 MED FILL — FLUTICASONE PROP 50 MCG SPR: 50 | 30 days supply | Qty: 16 | Fill #2

## 2018-01-24 ENCOUNTER — Ambulatory Visit
Admission: RE | Admit: 2018-01-24 | Discharge: 2018-01-24 | Disposition: A | Discharge status: Home / Self Care | Payer: PPO | Source: Ambulatory Visit | Attending: Family Medicine | Admitting: Family Medicine

## 2018-01-24 DIAGNOSIS — Z13820 Encounter for screening for osteoporosis: Secondary | ICD-10-CM | POA: Diagnosis not present

## 2018-01-24 DIAGNOSIS — Z780 Asymptomatic menopausal state: Secondary | ICD-10-CM | POA: Diagnosis not present

## 2018-01-24 DIAGNOSIS — M8580 Other specified disorders of bone density and structure, unspecified site: Secondary | ICD-10-CM

## 2018-02-21 DIAGNOSIS — L309 Dermatitis, unspecified: Secondary | ICD-10-CM | POA: Diagnosis not present

## 2018-02-25 MED FILL — FLUTICASONE PROP 50 MCG SPR: 50 | 90 days supply | Qty: 48 | Fill #0

## 2018-03-02 MED FILL — SYMBICORT 80-4.5 MCG INH: 80-4.5 | 30 days supply | Qty: 10 | Fill #0

## 2018-03-02 MED FILL — AZELASTINE HCL 0.05% DROPS: 0.05 | 30 days supply | Qty: 6 | Fill #1

## 2018-03-21 DIAGNOSIS — J4530 Mild persistent asthma, uncomplicated: Secondary | ICD-10-CM | POA: Diagnosis not present

## 2018-03-21 DIAGNOSIS — H1045 Other chronic allergic conjunctivitis: Secondary | ICD-10-CM | POA: Diagnosis not present

## 2018-03-21 DIAGNOSIS — J300 Vasomotor rhinitis: Secondary | ICD-10-CM | POA: Diagnosis not present

## 2018-03-21 MED FILL — predniSONE 10 MG TABS: 10 | 4 days supply | Qty: 10 | Fill #0

## 2018-03-24 MED FILL — MONTELUKAST SOD 10 MG TAB: 10 | 90 days supply | Qty: 90 | Fill #1

## 2018-04-11 MED FILL — OLMESARTAN-HCTZ 20-12.5 MG: 20-12.5 | 90 days supply | Qty: 90 | Fill #1

## 2018-04-25 MED FILL — TOLTERODINE TART ER 4 MG CA: 4 | 90 days supply | Qty: 90 | Fill #1

## 2018-05-27 ENCOUNTER — Other Ambulatory Visit: Payer: Self-pay | Admitting: Family Medicine

## 2018-05-27 DIAGNOSIS — Z1231 Encounter for screening mammogram for malignant neoplasm of breast: Secondary | ICD-10-CM

## 2018-06-15 MED FILL — MONTELUKAST SOD 10 MG TAB: 10 | 90 days supply | Qty: 90 | Fill #2

## 2018-06-15 MED FILL — FLUTICASONE PROP 50 MCG SPR: 50 | 90 days supply | Qty: 48 | Fill #1

## 2018-06-23 ENCOUNTER — Encounter: Payer: Self-pay | Admitting: Gastroenterology

## 2018-07-06 ENCOUNTER — Encounter: Payer: Self-pay | Admitting: Gastroenterology

## 2018-07-08 ENCOUNTER — Ambulatory Visit
Admission: RE | Admit: 2018-07-08 | Discharge: 2018-07-08 | Disposition: A | Discharge status: Home / Self Care | Payer: PPO | Source: Ambulatory Visit | Attending: Family Medicine | Admitting: Family Medicine

## 2018-07-08 DIAGNOSIS — Z1231 Encounter for screening mammogram for malignant neoplasm of breast: Secondary | ICD-10-CM

## 2018-07-14 MED FILL — OLMESARTAN-HCTZ 20-12.5 MG: 20-12.5 | 90 days supply | Qty: 90 | Fill #0

## 2018-07-21 MED FILL — SYMBICORT 80-4.5 MCG INH: 80-4.5 | 30 days supply | Qty: 10 | Fill #1

## 2018-08-03 MED FILL — TOLTERODINE TART ER 4 MG CA: 4 | 90 days supply | Qty: 90 | Fill #0

## 2018-08-17 ENCOUNTER — Ambulatory Visit (AMBULATORY_SURGERY_CENTER): Payer: Self-pay

## 2018-08-17 ENCOUNTER — Other Ambulatory Visit: Payer: Self-pay

## 2018-08-17 ENCOUNTER — Encounter: Payer: Self-pay | Admitting: Gastroenterology

## 2018-08-17 VITALS — Ht 61.0 in | Wt 154.8 lb

## 2018-08-17 DIAGNOSIS — Z800 Family history of malignant neoplasm of digestive organs: Secondary | ICD-10-CM

## 2018-08-17 NOTE — Progress Notes (Signed)
No egg or soy allergy known to patient  No issues with past sedation with any surgeries  or procedures, no intubation problems  No diet pills per patient No home 02 use per patient  No blood thinners per patient  Pt denies issues with constipation  No A fib or A flutter  EMMI video sent to pt's e mail , pt declined    

## 2018-08-18 ENCOUNTER — Encounter: Payer: Self-pay | Admitting: Plastic Surgery

## 2018-08-18 DIAGNOSIS — N6082 Other benign mammary dysplasias of left breast: Secondary | ICD-10-CM | POA: Diagnosis not present

## 2018-08-18 DIAGNOSIS — Z9882 Breast implant status: Secondary | ICD-10-CM | POA: Diagnosis not present

## 2018-08-30 MED FILL — AZELASTINE HCL 0.05% DROPS: 0.05 | 30 days supply | Qty: 6 | Fill #0

## 2018-08-31 ENCOUNTER — Encounter: Payer: Self-pay | Admitting: Gastroenterology

## 2018-08-31 ENCOUNTER — Ambulatory Visit (AMBULATORY_SURGERY_CENTER): Payer: PPO | Admitting: Gastroenterology

## 2018-08-31 VITALS — BP 126/79 | HR 84 | Temp 98.2°F | Resp 12 | Ht 61.0 in | Wt 154.0 lb

## 2018-08-31 DIAGNOSIS — Z800 Family history of malignant neoplasm of digestive organs: Secondary | ICD-10-CM | POA: Diagnosis not present

## 2018-08-31 DIAGNOSIS — Z86010 Personal history of colonic polyps: Secondary | ICD-10-CM | POA: Diagnosis not present

## 2018-08-31 DIAGNOSIS — D123 Benign neoplasm of transverse colon: Secondary | ICD-10-CM

## 2018-08-31 DIAGNOSIS — D122 Benign neoplasm of ascending colon: Secondary | ICD-10-CM | POA: Diagnosis not present

## 2018-08-31 DIAGNOSIS — Z1211 Encounter for screening for malignant neoplasm of colon: Secondary | ICD-10-CM | POA: Diagnosis not present

## 2018-08-31 DIAGNOSIS — D120 Benign neoplasm of cecum: Secondary | ICD-10-CM

## 2018-08-31 MED ORDER — SODIUM CHLORIDE 0.9 % IV SOLN: 500.0000 mL | Freq: Once | INTRAVENOUS | Status: DC

## 2018-08-31 NOTE — Op Note (Signed)
[facility] Patient Name: Alexis Guerrero Procedure Date: 08/31/2018 8:40 AM MRN: [id number] Endoscopist: Mauri Pole , MD Age: 67 Referring MD:  Date of Birth: [birth year] Gender: Female Account #: [id number] Procedure:                Colonoscopy Indications:              Screening patient at increased risk: Family history                            of 1st-degree relative with colorectal cancer at                            age 12 years (or older), High risk colon cancer                            surveillance: Personal history of colonic polyps,                            High risk colon cancer surveillance: Personal                            history of adenoma less than 10 mm in size in 2009,                            Last colonoscopy: 2014 Medicines:                Monitored Anesthesia Care Procedure:                Pre-Anesthesia Assessment:                           - Prior to the procedure, a History and Physical                            was performed, and patient medications and                            allergies were reviewed. The patient's tolerance of                            previous anesthesia was also reviewed. The risks                            and benefits of the procedure and the sedation                            options and risks were discussed with the patient.                            All questions were answered, and informed consent                            was obtained. Prior Anticoagulants: The patient has  taken no previous anticoagulant or antiplatelet                            agents. ASA Grade Assessment: II - A patient with                            mild systemic disease. After reviewing the risks                            and benefits, the patient was deemed in                            satisfactory condition to undergo the procedure.                           After obtaining informed consent, the  colonoscope                            was passed under direct vision. Throughout the                            procedure, the patient's blood pressure, pulse, and                            oxygen saturations were monitored continuously. The                            Colonoscope was introduced through the anus and                            advanced to the the cecum, identified by                            appendiceal orifice and ileocecal valve. The                            colonoscopy was performed without difficulty. The                            patient tolerated the procedure well. The quality                            of the bowel preparation was excellent. The                            ileocecal valve, appendiceal orifice, and rectum                            were photographed. Scope In: 8:46:42 AM Scope Out: 9:10:06 AM Scope Withdrawal Time: 0 hours 18 minutes 28 seconds  Total Procedure Duration: 0 hours 23 minutes 24 seconds  Findings:                 The perianal and digital rectal examinations were  normal.                           Four sessile polyps were found in the transverse                            colon and ileocecal valve. The polyps were 1 to 3                            mm in size. These polyps were removed with a cold                            biopsy forceps. Resection and retrieval were                            complete.                           Four sessile polyps were found in the transverse                            colon and ascending colon. The polyps were 4 to 7                            mm in size. These polyps were removed with a cold                            snare. Resection and retrieval were complete.                           A 5 mm polyp was found in the ascending colon. The                            polyp was pedunculated. The polyp was removed with                            a hot snare. Resection and  retrieval were complete.                           Scattered small and large-mouthed diverticula were                            found in the sigmoid colon, transverse colon and                            ascending colon.                           Non-bleeding internal hemorrhoids were found during                            retroflexion. The hemorrhoids were small. Complications:            No immediate complications. Estimated Blood Loss:  Estimated blood loss was minimal. Impression:               - Four 1 to 3 mm polyps in the transverse colon and                            at the ileocecal valve, removed with a cold biopsy                            forceps. Resected and retrieved.                           - Four 4 to 7 mm polyps in the transverse colon and                            in the ascending colon, removed with a cold snare.                            Resected and retrieved.                           - One 5 mm polyp in the ascending colon, removed                            with a hot snare. Resected and retrieved.                           - Diverticulosis in the sigmoid colon and in the                            ascending colon.                           - Non-bleeding internal hemorrhoids. Recommendation:           - Patient has a contact number available for                            emergencies. The signs and symptoms of potential                            delayed complications were discussed with the                            patient. Return to normal activities tomorrow.                            Written discharge instructions were provided to the                            patient.                           - Resume previous diet.                           -  Continue present medications.                           - Await pathology results.                           - Repeat colonoscopy in 3 years for surveillance                            based on  pathology results. Mauri Pole, MD 08/31/2018 9:17:10 AM This report has been signed electronically.

## 2018-08-31 NOTE — Progress Notes (Signed)
Alert and oriented x 3, pleased with MAC, report to RN

## 2018-08-31 NOTE — Patient Instructions (Addendum)
YOU HAD AN ENDOSCOPIC PROCEDURE TODAY AT THE [facility]:   Refer to the procedure report that was given to you for any specific questions about what was found during the examination.  If the procedure report does not answer your questions, please call your gastroenterologist to clarify.  If you requested that your care partner not be given the details of your procedure findings, then the procedure report has been included in a sealed envelope for you to review at your convenience later.  YOU SHOULD EXPECT: Some feelings of bloating in the abdomen. Passage of more gas than usual.  Walking can help get rid of the air that was put into your GI tract during the procedure and reduce the bloating. If you had a lower endoscopy (such as a colonoscopy or flexible sigmoidoscopy) you may notice spotting of blood in your stool or on the toilet paper. If you underwent a bowel prep for your procedure, you may not have a normal bowel movement for a few days.  Please Note:  You might notice some irritation and congestion in your nose or some drainage.  This is from the oxygen used during your procedure.  There is no need for concern and it should clear up in a day or so.  SYMPTOMS TO REPORT IMMEDIATELY:   Following lower endoscopy (colonoscopy or flexible sigmoidoscopy):  Excessive amounts of blood in the stool  Significant tenderness or worsening of abdominal pains  Swelling of the abdomen that is new, acute  Fever of 100F or higher  For urgent or emergent issues, a gastroenterologist can be reached at any hour by calling [phone].   DIET:  We do recommend a small meal at first, but then you may proceed to your regular diet.  Drink plenty of fluids but you should avoid alcoholic beverages for 24 hours.  MEDICATIONS: Continue present medications. No Aspirin, Ibuprofen, Naproxen, or other non-steroidal anti-inflammatory drugs for 2 weeks after polyp removal.  Please see handouts given to  you by your recovery nurse.  ACTIVITY:  You should plan to take it easy for the rest of today and you should NOT DRIVE or use heavy machinery until tomorrow (because of the sedation medicines used during the test).    FOLLOW UP: Our staff will call the number listed on your records the next business day following your procedure to check on you and address any questions or concerns that you may have regarding the information given to you following your procedure. If we do not reach you, we will leave a message.  However, if you are feeling well and you are not experiencing any problems, there is no need to return our call.  We will assume that you have returned to your regular daily activities without incident.  If any biopsies were taken you will be contacted by phone or by letter within the next 1-3 weeks.  Please call us at [phone] if you have not heard about the biopsies in 3 weeks.   Thank you for allowing us to provide for your healthcare needs today.  SIGNATURES/CONFIDENTIALITY: You and/or your care partner have signed paperwork which will be entered into your electronic medical record.  These signatures attest to the fact that that the information above on your After Visit Summary has been reviewed and is understood.  Full responsibility of the confidentiality of this discharge information lies with you and/or your care-partner. 

## 2018-08-31 NOTE — Progress Notes (Signed)
Called to room to assist during endoscopic procedure.  Patient ID and intended procedure confirmed with present staff. Received instructions for my participation in the procedure from the performing physician.  

## 2018-08-31 NOTE — Progress Notes (Signed)
Pt's states no medical or surgical changes since previsit or office visit. 

## 2018-09-01 ENCOUNTER — Telehealth: Payer: Self-pay

## 2018-09-01 NOTE — Telephone Encounter (Signed)
  Follow up Call-  Call back number 08/31/2018  Post procedure Call Back phone  # [phone]  Permission to leave phone message Yes  Some recent data might be hidden     Patient questions:  Do you have a fever, pain , or abdominal swelling? No. Pain Score  0 *  Have you tolerated food without any problems? Yes.    Have you been able to return to your normal activities? Yes.    Do you have any questions about your discharge instructions: Diet   No. Medications  No. Follow up visit  No.  Do you have questions or concerns about your Care? No.  Actions: * If pain score is 4 or above: No action needed, pain <4.

## 2018-09-06 ENCOUNTER — Encounter: Payer: Self-pay | Admitting: Gastroenterology

## 2018-09-08 DIAGNOSIS — Z9882 Breast implant status: Secondary | ICD-10-CM | POA: Diagnosis not present

## 2018-09-08 DIAGNOSIS — N6082 Other benign mammary dysplasias of left breast: Secondary | ICD-10-CM | POA: Diagnosis not present

## 2018-09-08 DIAGNOSIS — L905 Scar conditions and fibrosis of skin: Secondary | ICD-10-CM | POA: Diagnosis not present

## 2018-09-08 DIAGNOSIS — N6002 Solitary cyst of left breast: Secondary | ICD-10-CM | POA: Diagnosis not present

## 2018-09-09 MED FILL — ZOLPIDEM TARTRATE 5 MG TAB: 5 | 30 days supply | Qty: 30 | Fill #0

## 2018-09-18 IMAGING — MG 2D DIGITAL SCREENING BILATERAL MAMMOGRAM WITH IMPLANTS, CAD AND
8 of 17 series · 8 of 33 positions shown · non-contrast
Comparison: Previous exam(s).

CLINICAL DATA: Screening.

EXAM:
2D DIGITAL SCREENING BILATERAL MAMMOGRAM WITH IMPLANTS, CAD AND
ADJUNCT TOMO
The patient has retropectoral implants. Standard and implant
displaced views were performed.

[R MLO (1 of 2)]
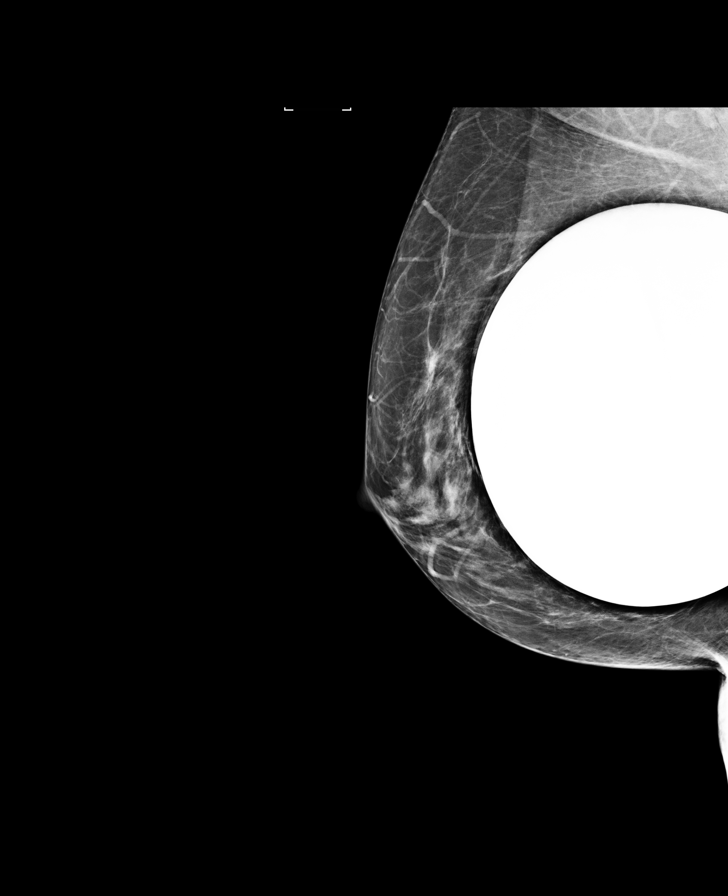

[L CC]
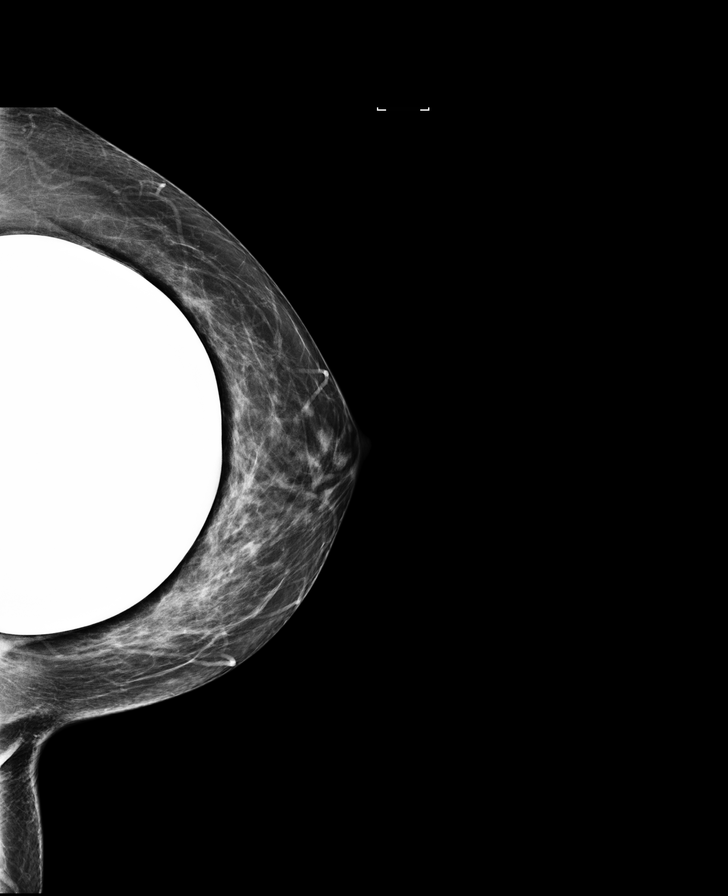

[R MLO (2 of 2)]
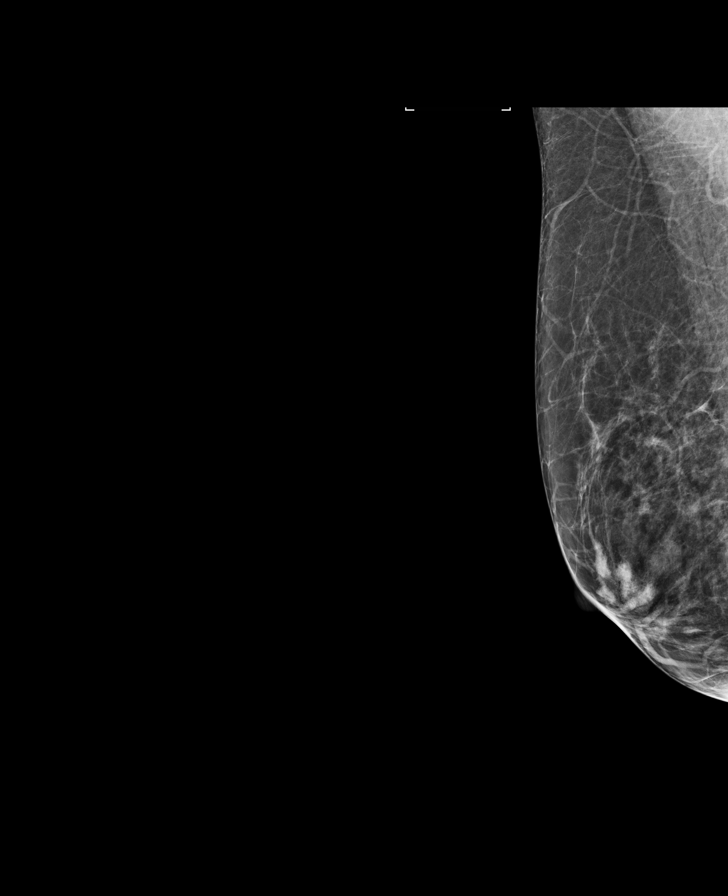

[L MLO]
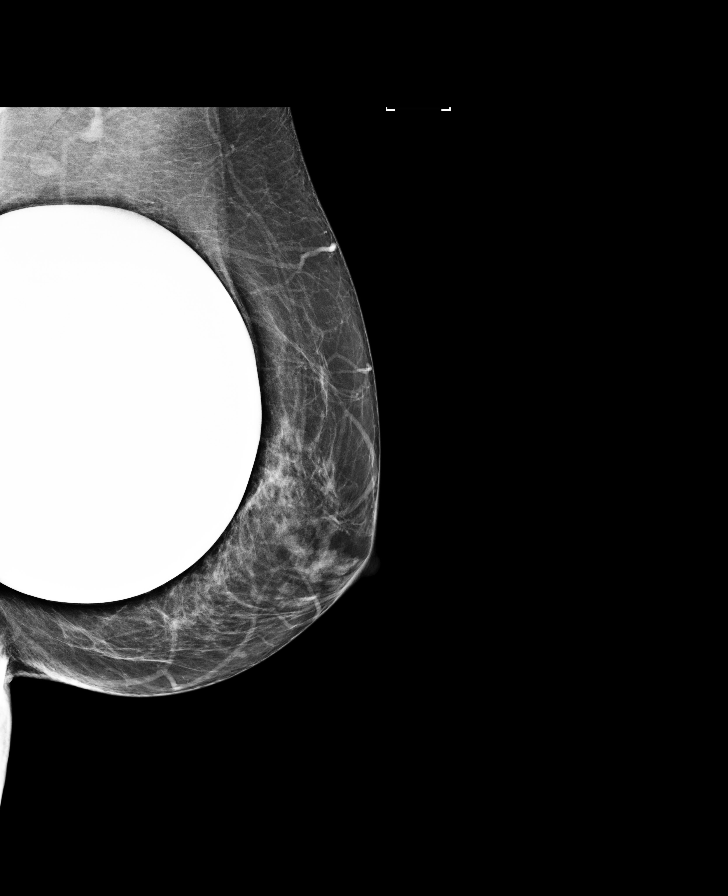

[R CC (1 of 2)]
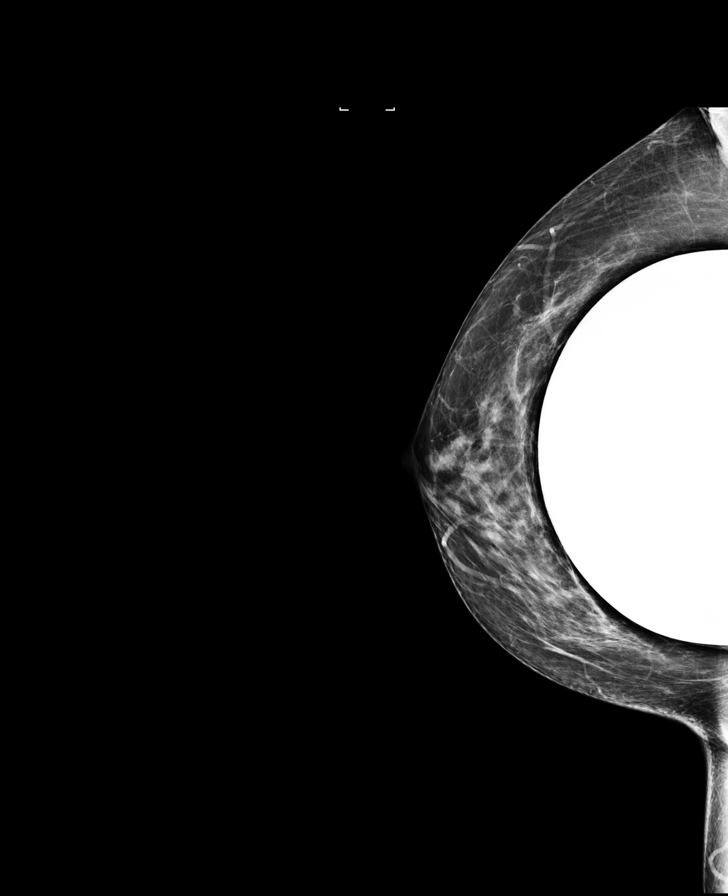

[R CC (2 of 2)]
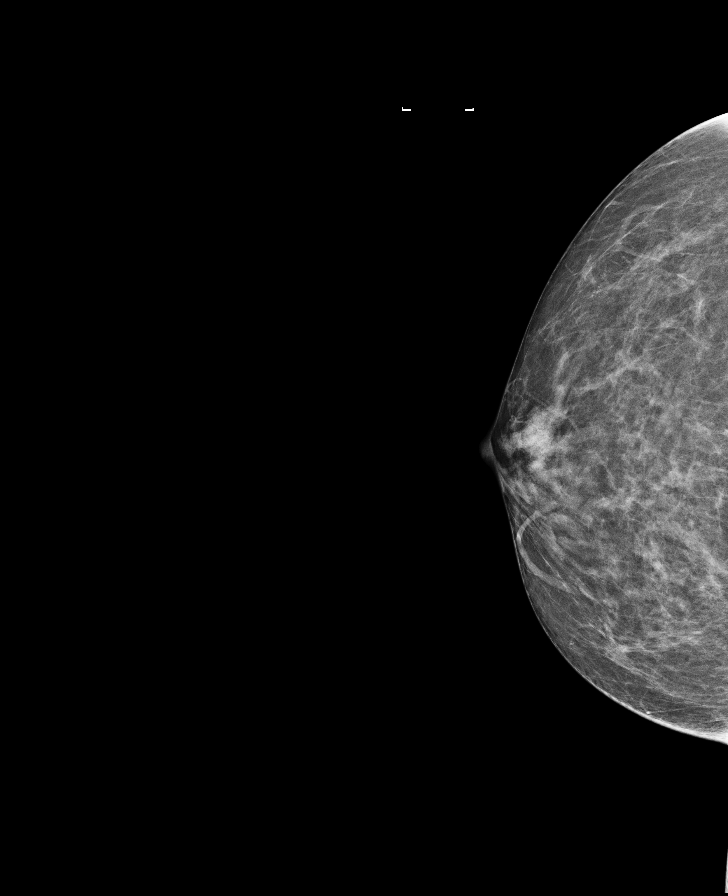

[L CC synth-2D]
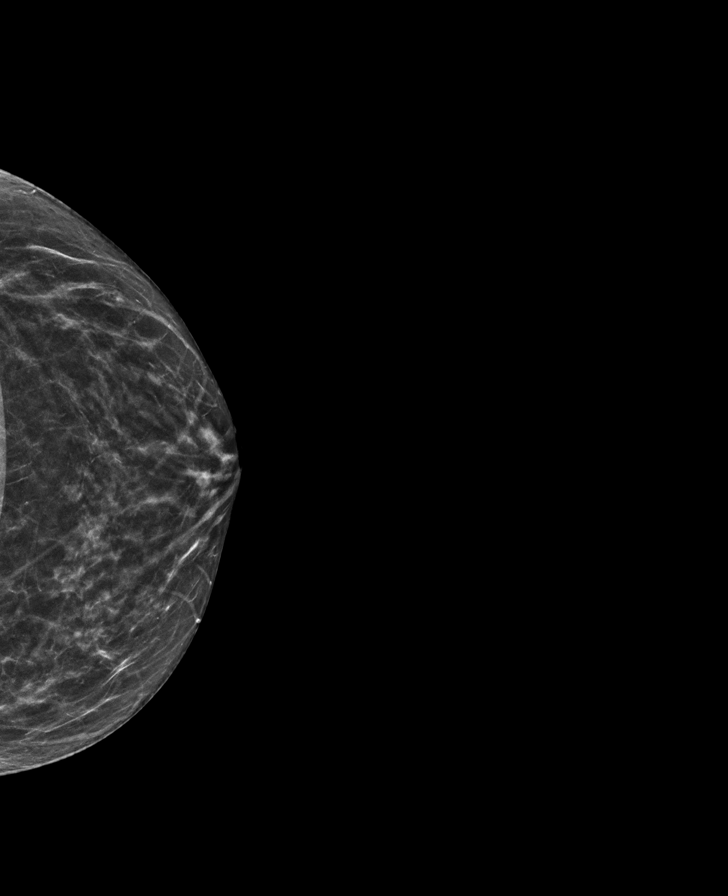

[R MLO synth-2D]
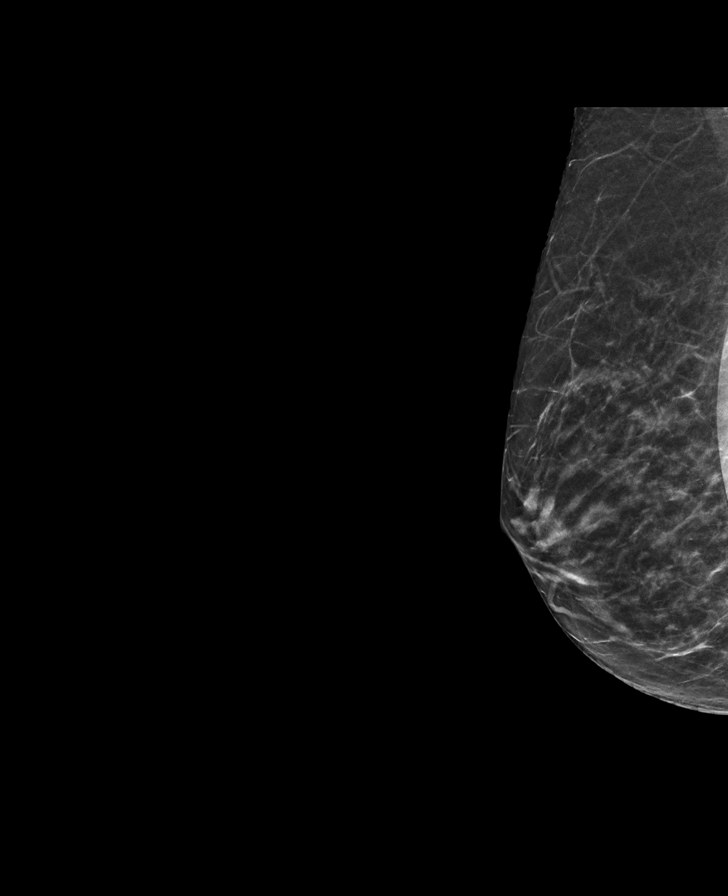

[8 of 33 positions shown; findings below may reference images not displayed]

ACR Breast Density Category b: There are scattered areas of
fibroglandular density.
FINDINGS: An asymmetry is identified in the subareolar left breast on the CC
projection. There are no findings suspicious for malignancy on the
right. Images were processed with CAD.
IMPRESSION: Left breast asymmetry.

No mammographic evidence of malignancy on the right. A result letter
of this screening mammogram will be mailed directly to the patient.

RECOMMENDATION:
Additional mammographic views on the left.

BI-RADS CATEGORY  0: Incomplete. Need additional imaging evaluation
and/or prior mammograms for comparison.

## 2018-09-19 MED FILL — MONTELUKAST SOD 10 MG TAB: 10 | 30 days supply | Qty: 30 | Fill #0

## 2018-09-26 DIAGNOSIS — J4530 Mild persistent asthma, uncomplicated: Secondary | ICD-10-CM | POA: Diagnosis not present

## 2018-09-26 DIAGNOSIS — J300 Vasomotor rhinitis: Secondary | ICD-10-CM | POA: Diagnosis not present

## 2018-09-26 DIAGNOSIS — J019 Acute sinusitis, unspecified: Secondary | ICD-10-CM | POA: Diagnosis not present

## 2018-09-26 DIAGNOSIS — H1045 Other chronic allergic conjunctivitis: Secondary | ICD-10-CM | POA: Diagnosis not present

## 2018-09-26 MED FILL — AMOX-CLAV 875-125 MG TABLET: 875-125 | 10 days supply | Qty: 20 | Fill #0

## 2018-10-05 MED FILL — OLMESARTAN-HCTZ 20-12.5 MG: 20-12.5 | 90 days supply | Qty: 90 | Fill #1

## 2018-10-05 MED FILL — FLUTICASONE PROP 50 MCG SPR: 50 | 90 days supply | Qty: 48 | Fill #2

## 2018-10-12 MED FILL — MONTELUKAST SOD 10 MG TAB: 10 | 30 days supply | Qty: 30 | Fill #0

## 2018-10-24 MED FILL — CYCLOBENZAPRINE 10 MG TAB: 10 | 10 days supply | Qty: 30 | Fill #1

## 2018-11-21 MED FILL — SYMBICORT 80-4.5 MCG INH: 80-4.5 | 30 days supply | Qty: 10 | Fill #0

## 2018-11-21 MED FILL — MONTELUKAST SOD 10 MG TAB: 10 | 30 days supply | Qty: 30 | Fill #1

## 2018-12-19 DIAGNOSIS — H52203 Unspecified astigmatism, bilateral: Secondary | ICD-10-CM | POA: Diagnosis not present

## 2018-12-19 DIAGNOSIS — Z961 Presence of intraocular lens: Secondary | ICD-10-CM | POA: Diagnosis not present

## 2018-12-19 DIAGNOSIS — H524 Presbyopia: Secondary | ICD-10-CM | POA: Diagnosis not present

## 2018-12-26 MED FILL — MONTELUKAST SOD 10 MG TAB: 10 | 30 days supply | Qty: 30 | Fill #2

## 2019-01-10 DIAGNOSIS — J45909 Unspecified asthma, uncomplicated: Secondary | ICD-10-CM | POA: Diagnosis not present

## 2019-01-10 DIAGNOSIS — Z0000 Encounter for general adult medical examination without abnormal findings: Secondary | ICD-10-CM | POA: Diagnosis not present

## 2019-01-10 DIAGNOSIS — M8588 Other specified disorders of bone density and structure, other site: Secondary | ICD-10-CM | POA: Diagnosis not present

## 2019-01-10 DIAGNOSIS — Z79899 Other long term (current) drug therapy: Secondary | ICD-10-CM | POA: Diagnosis not present

## 2019-01-10 DIAGNOSIS — E559 Vitamin D deficiency, unspecified: Secondary | ICD-10-CM | POA: Diagnosis not present

## 2019-01-10 DIAGNOSIS — G479 Sleep disorder, unspecified: Secondary | ICD-10-CM | POA: Diagnosis not present

## 2019-01-10 DIAGNOSIS — I10 Essential (primary) hypertension: Secondary | ICD-10-CM | POA: Diagnosis not present

## 2019-01-16 MED FILL — FLUTICASONE PROP 50 MCG SPR: 50 | 90 days supply | Qty: 48 | Fill #0 | Status: TO

## 2019-01-16 MED FILL — OLMESARTAN-HCTZ 20-12.5 MG: 20-12.5 | 90 days supply | Qty: 90 | Fill #0 | Status: TO

## 2019-01-23 MED FILL — MONTELUKAST SOD 10 MG TAB: 10 | 30 days supply | Qty: 30 | Fill #3 | Status: TO

## 2019-02-08 DIAGNOSIS — M62830 Muscle spasm of back: Secondary | ICD-10-CM | POA: Diagnosis not present

## 2019-02-08 DIAGNOSIS — R109 Unspecified abdominal pain: Secondary | ICD-10-CM | POA: Diagnosis not present

## 2019-02-08 MED FILL — CYCLOBENZAPRINE 10 MG TAB: 10 | 30 days supply | Qty: 90 | Fill #0

## 2019-02-08 MED FILL — CELECOXIB 200 MG CAP: 200 | 90 days supply | Qty: 90 | Fill #0

## 2019-02-27 MED FILL — MONTELUKAST SOD 10 MG TAB: 10 | 30 days supply | Qty: 30 | Fill #0

## 2019-03-27 MED FILL — MONTELUKAST SOD 10 MG TAB: 10 | 30 days supply | Qty: 30 | Fill #1

## 2019-04-05 DIAGNOSIS — J309 Allergic rhinitis, unspecified: Secondary | ICD-10-CM | POA: Diagnosis not present

## 2019-04-05 DIAGNOSIS — R110 Nausea: Secondary | ICD-10-CM | POA: Diagnosis not present

## 2019-04-05 MED FILL — AMOXICILLIN 875 MG TABS: 875 | 10 days supply | Qty: 20 | Fill #0

## 2019-04-10 MED FILL — AZELASTINE HCL 0.05% DROPS: 0.05 | 30 days supply | Qty: 6 | Fill #0

## 2019-04-21 DIAGNOSIS — J45909 Unspecified asthma, uncomplicated: Secondary | ICD-10-CM | POA: Diagnosis not present

## 2019-04-21 DIAGNOSIS — I10 Essential (primary) hypertension: Secondary | ICD-10-CM | POA: Diagnosis not present

## 2019-04-25 MED FILL — ZOLPIDEM TARTRATE 5 MG TAB: 5 | 30 days supply | Qty: 30 | Fill #0

## 2019-05-17 DIAGNOSIS — J4530 Mild persistent asthma, uncomplicated: Secondary | ICD-10-CM | POA: Diagnosis not present

## 2019-05-17 DIAGNOSIS — H1045 Other chronic allergic conjunctivitis: Secondary | ICD-10-CM | POA: Diagnosis not present

## 2019-05-17 DIAGNOSIS — J300 Vasomotor rhinitis: Secondary | ICD-10-CM | POA: Diagnosis not present

## 2019-05-23 DIAGNOSIS — I10 Essential (primary) hypertension: Secondary | ICD-10-CM | POA: Diagnosis not present

## 2019-05-23 DIAGNOSIS — J45909 Unspecified asthma, uncomplicated: Secondary | ICD-10-CM | POA: Diagnosis not present

## 2019-05-31 ENCOUNTER — Other Ambulatory Visit: Payer: Self-pay | Admitting: Family Medicine

## 2019-05-31 DIAGNOSIS — Z1231 Encounter for screening mammogram for malignant neoplasm of breast: Secondary | ICD-10-CM

## 2019-07-14 ENCOUNTER — Other Ambulatory Visit: Payer: Self-pay

## 2019-07-14 ENCOUNTER — Ambulatory Visit
Admission: RE | Admit: 2019-07-14 | Discharge: 2019-07-14 | Disposition: A | Discharge status: Home / Self Care | Payer: PPO | Source: Ambulatory Visit | Attending: Family Medicine | Admitting: Family Medicine

## 2019-07-14 DIAGNOSIS — Z1231 Encounter for screening mammogram for malignant neoplasm of breast: Secondary | ICD-10-CM

## 2019-08-30 DIAGNOSIS — M25562 Pain in left knee: Secondary | ICD-10-CM | POA: Diagnosis not present

## 2019-09-13 DIAGNOSIS — M2012 Hallux valgus (acquired), left foot: Secondary | ICD-10-CM | POA: Diagnosis not present

## 2019-12-08 DIAGNOSIS — M1712 Unilateral primary osteoarthritis, left knee: Secondary | ICD-10-CM | POA: Diagnosis not present

## 2019-12-18 DIAGNOSIS — I10 Essential (primary) hypertension: Secondary | ICD-10-CM | POA: Diagnosis not present

## 2019-12-18 DIAGNOSIS — J45909 Unspecified asthma, uncomplicated: Secondary | ICD-10-CM | POA: Diagnosis not present

## 2019-12-20 DIAGNOSIS — H52203 Unspecified astigmatism, bilateral: Secondary | ICD-10-CM | POA: Diagnosis not present

## 2019-12-20 DIAGNOSIS — H524 Presbyopia: Secondary | ICD-10-CM | POA: Diagnosis not present

## 2019-12-20 DIAGNOSIS — Z961 Presence of intraocular lens: Secondary | ICD-10-CM | POA: Diagnosis not present

## 2020-01-08 DIAGNOSIS — Z79899 Other long term (current) drug therapy: Secondary | ICD-10-CM | POA: Diagnosis not present

## 2020-01-08 DIAGNOSIS — J45909 Unspecified asthma, uncomplicated: Secondary | ICD-10-CM | POA: Diagnosis not present

## 2020-01-08 DIAGNOSIS — I10 Essential (primary) hypertension: Secondary | ICD-10-CM | POA: Diagnosis not present

## 2020-01-08 DIAGNOSIS — M8588 Other specified disorders of bone density and structure, other site: Secondary | ICD-10-CM | POA: Diagnosis not present

## 2020-01-08 DIAGNOSIS — R110 Nausea: Secondary | ICD-10-CM | POA: Diagnosis not present

## 2020-01-08 DIAGNOSIS — G479 Sleep disorder, unspecified: Secondary | ICD-10-CM | POA: Diagnosis not present

## 2020-01-08 DIAGNOSIS — E559 Vitamin D deficiency, unspecified: Secondary | ICD-10-CM | POA: Diagnosis not present

## 2020-01-08 DIAGNOSIS — Z136 Encounter for screening for cardiovascular disorders: Secondary | ICD-10-CM | POA: Diagnosis not present

## 2020-01-08 DIAGNOSIS — J309 Allergic rhinitis, unspecified: Secondary | ICD-10-CM | POA: Diagnosis not present

## 2020-01-08 DIAGNOSIS — M62830 Muscle spasm of back: Secondary | ICD-10-CM | POA: Diagnosis not present

## 2020-01-08 DIAGNOSIS — R109 Unspecified abdominal pain: Secondary | ICD-10-CM | POA: Diagnosis not present

## 2020-01-12 DIAGNOSIS — G479 Sleep disorder, unspecified: Secondary | ICD-10-CM | POA: Diagnosis not present

## 2020-01-12 DIAGNOSIS — Z0000 Encounter for general adult medical examination without abnormal findings: Secondary | ICD-10-CM | POA: Diagnosis not present

## 2020-01-12 DIAGNOSIS — M8588 Other specified disorders of bone density and structure, other site: Secondary | ICD-10-CM | POA: Diagnosis not present

## 2020-01-12 DIAGNOSIS — I10 Essential (primary) hypertension: Secondary | ICD-10-CM | POA: Diagnosis not present

## 2020-01-12 DIAGNOSIS — J45909 Unspecified asthma, uncomplicated: Secondary | ICD-10-CM | POA: Diagnosis not present

## 2020-01-12 DIAGNOSIS — J309 Allergic rhinitis, unspecified: Secondary | ICD-10-CM | POA: Diagnosis not present

## 2020-01-12 DIAGNOSIS — R3915 Urgency of urination: Secondary | ICD-10-CM | POA: Diagnosis not present

## 2020-01-12 DIAGNOSIS — E559 Vitamin D deficiency, unspecified: Secondary | ICD-10-CM | POA: Diagnosis not present

## 2020-03-21 DIAGNOSIS — J45909 Unspecified asthma, uncomplicated: Secondary | ICD-10-CM | POA: Diagnosis not present

## 2020-03-21 DIAGNOSIS — I10 Essential (primary) hypertension: Secondary | ICD-10-CM | POA: Diagnosis not present

## 2020-04-17 DIAGNOSIS — J45909 Unspecified asthma, uncomplicated: Secondary | ICD-10-CM | POA: Diagnosis not present

## 2020-04-17 DIAGNOSIS — I10 Essential (primary) hypertension: Secondary | ICD-10-CM | POA: Diagnosis not present

## 2020-05-14 DIAGNOSIS — J300 Vasomotor rhinitis: Secondary | ICD-10-CM | POA: Diagnosis not present

## 2020-05-14 DIAGNOSIS — J4530 Mild persistent asthma, uncomplicated: Secondary | ICD-10-CM | POA: Diagnosis not present

## 2020-05-14 DIAGNOSIS — H1045 Other chronic allergic conjunctivitis: Secondary | ICD-10-CM | POA: Diagnosis not present

## 2020-05-15 DIAGNOSIS — M1712 Unilateral primary osteoarthritis, left knee: Secondary | ICD-10-CM | POA: Diagnosis not present

## 2020-06-03 ENCOUNTER — Other Ambulatory Visit: Payer: Self-pay | Admitting: Family Medicine

## 2020-06-03 DIAGNOSIS — Z1231 Encounter for screening mammogram for malignant neoplasm of breast: Secondary | ICD-10-CM

## 2020-06-04 DIAGNOSIS — I10 Essential (primary) hypertension: Secondary | ICD-10-CM | POA: Diagnosis not present

## 2020-06-04 DIAGNOSIS — J45909 Unspecified asthma, uncomplicated: Secondary | ICD-10-CM | POA: Diagnosis not present

## 2020-06-14 DIAGNOSIS — M1712 Unilateral primary osteoarthritis, left knee: Secondary | ICD-10-CM | POA: Diagnosis not present

## 2020-06-21 DIAGNOSIS — M1712 Unilateral primary osteoarthritis, left knee: Secondary | ICD-10-CM | POA: Diagnosis not present

## 2020-06-28 DIAGNOSIS — M1712 Unilateral primary osteoarthritis, left knee: Secondary | ICD-10-CM | POA: Diagnosis not present

## 2020-07-15 ENCOUNTER — Ambulatory Visit
Admission: RE | Admit: 2020-07-15 | Discharge: 2020-07-15 | Disposition: A | Discharge status: Home / Self Care | Payer: PPO | Source: Ambulatory Visit | Attending: Family Medicine | Admitting: Family Medicine

## 2020-07-15 ENCOUNTER — Other Ambulatory Visit: Payer: Self-pay

## 2020-07-15 DIAGNOSIS — Z1231 Encounter for screening mammogram for malignant neoplasm of breast: Secondary | ICD-10-CM | POA: Diagnosis not present

## 2020-07-16 ENCOUNTER — Other Ambulatory Visit: Payer: Self-pay | Admitting: Family Medicine

## 2020-07-16 DIAGNOSIS — R928 Other abnormal and inconclusive findings on diagnostic imaging of breast: Secondary | ICD-10-CM

## 2020-07-23 DIAGNOSIS — J45909 Unspecified asthma, uncomplicated: Secondary | ICD-10-CM | POA: Diagnosis not present

## 2020-07-23 DIAGNOSIS — I10 Essential (primary) hypertension: Secondary | ICD-10-CM | POA: Diagnosis not present

## 2020-07-24 ENCOUNTER — Ambulatory Visit
Admission: RE | Admit: 2020-07-24 | Discharge: 2020-07-24 | Disposition: A | Discharge status: Home / Self Care | Payer: PPO | Source: Ambulatory Visit | Attending: Family Medicine | Admitting: Family Medicine

## 2020-07-24 ENCOUNTER — Other Ambulatory Visit: Payer: Self-pay | Admitting: Family Medicine

## 2020-07-24 ENCOUNTER — Ambulatory Visit: Payer: PPO

## 2020-07-24 ENCOUNTER — Other Ambulatory Visit: Payer: Self-pay

## 2020-07-24 DIAGNOSIS — R928 Other abnormal and inconclusive findings on diagnostic imaging of breast: Secondary | ICD-10-CM

## 2020-09-18 DIAGNOSIS — J45909 Unspecified asthma, uncomplicated: Secondary | ICD-10-CM | POA: Diagnosis not present

## 2020-09-18 DIAGNOSIS — I10 Essential (primary) hypertension: Secondary | ICD-10-CM | POA: Diagnosis not present

## 2020-12-04 DIAGNOSIS — J45909 Unspecified asthma, uncomplicated: Secondary | ICD-10-CM | POA: Diagnosis not present

## 2020-12-04 DIAGNOSIS — I10 Essential (primary) hypertension: Secondary | ICD-10-CM | POA: Diagnosis not present

## 2020-12-18 DIAGNOSIS — Z961 Presence of intraocular lens: Secondary | ICD-10-CM | POA: Diagnosis not present

## 2021-01-21 DIAGNOSIS — R3915 Urgency of urination: Secondary | ICD-10-CM | POA: Diagnosis not present

## 2021-01-21 DIAGNOSIS — Z1389 Encounter for screening for other disorder: Secondary | ICD-10-CM | POA: Diagnosis not present

## 2021-01-21 DIAGNOSIS — E559 Vitamin D deficiency, unspecified: Secondary | ICD-10-CM | POA: Diagnosis not present

## 2021-01-21 DIAGNOSIS — Z0000 Encounter for general adult medical examination without abnormal findings: Secondary | ICD-10-CM | POA: Diagnosis not present

## 2021-01-21 DIAGNOSIS — G479 Sleep disorder, unspecified: Secondary | ICD-10-CM | POA: Diagnosis not present

## 2021-01-21 DIAGNOSIS — E663 Overweight: Secondary | ICD-10-CM | POA: Diagnosis not present

## 2021-01-21 DIAGNOSIS — I10 Essential (primary) hypertension: Secondary | ICD-10-CM | POA: Diagnosis not present

## 2021-01-21 DIAGNOSIS — M546 Pain in thoracic spine: Secondary | ICD-10-CM | POA: Diagnosis not present

## 2021-01-30 ENCOUNTER — Other Ambulatory Visit: Payer: Self-pay | Admitting: Family Medicine

## 2021-01-30 ENCOUNTER — Other Ambulatory Visit (HOSPITAL_COMMUNITY): Payer: Self-pay | Admitting: Family Medicine

## 2021-01-30 DIAGNOSIS — I10 Essential (primary) hypertension: Secondary | ICD-10-CM

## 2021-02-04 DIAGNOSIS — J45909 Unspecified asthma, uncomplicated: Secondary | ICD-10-CM | POA: Diagnosis not present

## 2021-02-04 DIAGNOSIS — I10 Essential (primary) hypertension: Secondary | ICD-10-CM | POA: Diagnosis not present

## 2021-02-12 ENCOUNTER — Other Ambulatory Visit: Payer: Self-pay

## 2021-02-12 ENCOUNTER — Ambulatory Visit (HOSPITAL_COMMUNITY)
Admission: RE | Admit: 2021-02-12 | Discharge: 2021-02-12 | Disposition: A | Discharge status: Home / Self Care | Payer: Self-pay | Source: Ambulatory Visit | Attending: Family Medicine | Admitting: Family Medicine

## 2021-02-12 ENCOUNTER — Ambulatory Visit (HOSPITAL_COMMUNITY): Payer: PPO

## 2021-02-12 DIAGNOSIS — I10 Essential (primary) hypertension: Secondary | ICD-10-CM | POA: Insufficient documentation

## 2021-04-01 DIAGNOSIS — I10 Essential (primary) hypertension: Secondary | ICD-10-CM | POA: Diagnosis not present

## 2021-04-01 DIAGNOSIS — J45909 Unspecified asthma, uncomplicated: Secondary | ICD-10-CM | POA: Diagnosis not present

## 2021-04-03 DIAGNOSIS — J019 Acute sinusitis, unspecified: Secondary | ICD-10-CM | POA: Diagnosis not present

## 2021-04-03 DIAGNOSIS — H1045 Other chronic allergic conjunctivitis: Secondary | ICD-10-CM | POA: Diagnosis not present

## 2021-04-03 DIAGNOSIS — J4530 Mild persistent asthma, uncomplicated: Secondary | ICD-10-CM | POA: Diagnosis not present

## 2021-04-03 DIAGNOSIS — J300 Vasomotor rhinitis: Secondary | ICD-10-CM | POA: Diagnosis not present

## 2021-05-14 DIAGNOSIS — J300 Vasomotor rhinitis: Secondary | ICD-10-CM | POA: Diagnosis not present

## 2021-05-14 DIAGNOSIS — J4530 Mild persistent asthma, uncomplicated: Secondary | ICD-10-CM | POA: Diagnosis not present

## 2021-05-14 DIAGNOSIS — H1045 Other chronic allergic conjunctivitis: Secondary | ICD-10-CM | POA: Diagnosis not present

## 2021-06-02 DIAGNOSIS — J45909 Unspecified asthma, uncomplicated: Secondary | ICD-10-CM | POA: Diagnosis not present

## 2021-06-02 DIAGNOSIS — I10 Essential (primary) hypertension: Secondary | ICD-10-CM | POA: Diagnosis not present

## 2021-06-20 ENCOUNTER — Other Ambulatory Visit: Payer: Self-pay | Admitting: Family Medicine

## 2021-06-20 DIAGNOSIS — H608X3 Other otitis externa, bilateral: Secondary | ICD-10-CM | POA: Diagnosis not present

## 2021-06-20 DIAGNOSIS — J324 Chronic pansinusitis: Secondary | ICD-10-CM | POA: Diagnosis not present

## 2021-06-20 DIAGNOSIS — Z1231 Encounter for screening mammogram for malignant neoplasm of breast: Secondary | ICD-10-CM

## 2021-06-20 DIAGNOSIS — J338 Other polyp of sinus: Secondary | ICD-10-CM | POA: Diagnosis not present

## 2021-07-15 DIAGNOSIS — J343 Hypertrophy of nasal turbinates: Secondary | ICD-10-CM | POA: Diagnosis not present

## 2021-07-15 DIAGNOSIS — J338 Other polyp of sinus: Secondary | ICD-10-CM | POA: Diagnosis not present

## 2021-07-15 DIAGNOSIS — R42 Dizziness and giddiness: Secondary | ICD-10-CM | POA: Diagnosis not present

## 2021-07-15 DIAGNOSIS — J310 Chronic rhinitis: Secondary | ICD-10-CM | POA: Diagnosis not present

## 2021-08-12 ENCOUNTER — Ambulatory Visit
Admission: RE | Admit: 2021-08-12 | Discharge: 2021-08-12 | Disposition: A | Discharge status: Home / Self Care | Payer: PPO | Source: Ambulatory Visit | Attending: Family Medicine | Admitting: Family Medicine

## 2021-08-12 ENCOUNTER — Other Ambulatory Visit: Payer: Self-pay

## 2021-08-12 DIAGNOSIS — Z1231 Encounter for screening mammogram for malignant neoplasm of breast: Secondary | ICD-10-CM | POA: Diagnosis not present

## 2021-08-13 DIAGNOSIS — I10 Essential (primary) hypertension: Secondary | ICD-10-CM | POA: Diagnosis not present

## 2021-08-13 DIAGNOSIS — J45909 Unspecified asthma, uncomplicated: Secondary | ICD-10-CM | POA: Diagnosis not present

## 2021-09-30 ENCOUNTER — Encounter: Payer: Self-pay | Admitting: Gastroenterology

## 2021-10-27 ENCOUNTER — Encounter: Payer: Self-pay | Admitting: Gastroenterology

## 2021-12-12 ENCOUNTER — Ambulatory Visit (AMBULATORY_SURGERY_CENTER): Payer: Medicare Other | Admitting: *Deleted

## 2021-12-12 ENCOUNTER — Other Ambulatory Visit: Payer: Self-pay

## 2021-12-12 VITALS — Ht 61.0 in | Wt 150.0 lb

## 2021-12-12 DIAGNOSIS — Z86010 Personal history of colonic polyps: Secondary | ICD-10-CM

## 2021-12-12 NOTE — Progress Notes (Signed)

## 2021-12-22 ENCOUNTER — Encounter: Payer: Self-pay | Admitting: Gastroenterology

## 2021-12-22 DIAGNOSIS — Z961 Presence of intraocular lens: Secondary | ICD-10-CM | POA: Diagnosis not present

## 2021-12-22 DIAGNOSIS — H524 Presbyopia: Secondary | ICD-10-CM | POA: Diagnosis not present

## 2021-12-22 DIAGNOSIS — H52203 Unspecified astigmatism, bilateral: Secondary | ICD-10-CM | POA: Diagnosis not present

## 2021-12-26 ENCOUNTER — Other Ambulatory Visit: Payer: Self-pay

## 2021-12-26 ENCOUNTER — Ambulatory Visit (AMBULATORY_SURGERY_CENTER): Payer: Medicare Other | Admitting: Gastroenterology

## 2021-12-26 ENCOUNTER — Encounter: Payer: Self-pay | Admitting: Gastroenterology

## 2021-12-26 VITALS — BP 138/65 | HR 85 | Temp 97.1°F | Resp 16 | Ht 61.0 in | Wt 150.0 lb

## 2021-12-26 DIAGNOSIS — D128 Benign neoplasm of rectum: Secondary | ICD-10-CM | POA: Diagnosis not present

## 2021-12-26 DIAGNOSIS — D122 Benign neoplasm of ascending colon: Secondary | ICD-10-CM

## 2021-12-26 DIAGNOSIS — Z86010 Personal history of colonic polyps: Secondary | ICD-10-CM

## 2021-12-26 DIAGNOSIS — D123 Benign neoplasm of transverse colon: Secondary | ICD-10-CM

## 2021-12-26 MED ORDER — SODIUM CHLORIDE 0.9 % IV SOLN: 500.0000 mL | Freq: Once | INTRAVENOUS | Status: DC

## 2021-12-26 NOTE — Progress Notes (Signed)
[facility] History and Physical   Primary Care Physician:  Kathyrn Lass, MD   Reason for Procedure:  History of adenomatous colon polyps and family h/o colon cancer  Plan:    Surveillance colonoscopy with possible interventions as needed     HPI: Alexis Guerrero is a very pleasant 71 y.o. female here for surveillance colonoscopy. Denies any nausea, vomiting, abdominal pain, melena or bright red blood per rectum  The risks and benefits as well as alternatives of endoscopic procedure(s) have been discussed and reviewed. All questions answered. The patient agrees to proceed.    Past Medical History:  Diagnosis Date   Asthma    Cataract    GERD (gastroesophageal reflux disease)    Hypertension    Osteopenia 2009   Shortness of breath    Viral meningitis     Past Surgical History:  Procedure Laterality Date   ABDOMINAL HYSTERECTOMY     AUGMENTATION MAMMAPLASTY     CATARACT EXTRACTION  03/08/2013   CATARACT EXTRACTION W/PHACO  10/20/2011   Procedure: CATARACT EXTRACTION PHACO AND INTRAOCULAR LENS PLACEMENT ([place]);  Surgeon: Elta Guadeloupe T. Gershon Crane;  Location: [facility];  Service: Ophthalmology;  Laterality: Right;  CDE:8.54   HIATAL HERNIA REPAIR  age 61   MAXILLARY SINUS LIFT     TUBAL LIGATION      Prior to Admission medications   Medication Sig Start Date End Date Taking? Authorizing Provider  acetaminophen (TYLENOL) 500 MG tablet Take 1,000 mg by mouth every 6 (six) hours as needed. For pain   Yes [provider]  budesonide-formoterol (SYMBICORT) 80-4.5 MCG/ACT inhaler Inhale 2 puffs into the lungs every morning.    Yes [provider]  calcium-vitamin D (OSCAL WITH D) 500-200 MG-UNIT per tablet Take 1 tablet by mouth every evening.    Yes [provider]  cyclobenzaprine (FLEXERIL) 10 MG tablet Take 10 mg by mouth 3 (three) times daily as needed for muscle spasms.   Yes [provider]  diphenhydrAMINE (BENADRYL) 25 mg  capsule Take 25 mg by mouth every 6 (six) hours as needed.   Yes [provider]  fluticasone (FLONASE) 50 MCG/ACT nasal spray Place 2 sprays into the nose daily.   Yes [provider]  ketotifen (ZADITOR) 0.025 % ophthalmic solution Apply 1 drop to eye as needed. For dry eyes    Yes [provider]  loratadine (CLARITIN) 10 MG tablet Take 10 mg by mouth every evening.    Yes [provider]  mometasone (ELOCON) 0.1 % cream Apply topically daily as needed. 06/20/21  Yes [provider]  montelukast (SINGULAIR) 10 MG tablet TAKE 1 TABLET BY MOUTH IN THE EVENING ONCE A DAY 11/22/17  Yes [provider]  olmesartan-hydrochlorothiazide (BENICAR HCT) 20-12.5 MG per tablet Take 1 tablet by mouth every morning.    Yes [provider]  albuterol (PROVENTIL HFA;VENTOLIN HFA) 108 (90 BASE) MCG/ACT inhaler Inhale 2 puffs into the lungs every 6 (six) hours as needed. For SOB    [provider]  celecoxib (CELEBREX) 200 MG capsule Take 200 mg by mouth daily as needed.    [provider]  tolterodine (DETROL LA) 4 MG 24 hr capsule Take 4 mg by mouth daily as needed. Patient not taking: Reported on 12/26/2021    [provider]  zolpidem (AMBIEN) 5 MG tablet Take 5 mg by mouth at bedtime as needed for sleep.    [provider]    Current Outpatient Medications  Medication  Sig Dispense Refill   acetaminophen (TYLENOL) 500 MG tablet Take 1,000 mg by mouth every 6 (six) hours as needed. For pain     budesonide-formoterol (SYMBICORT) 80-4.5 MCG/ACT inhaler Inhale 2 puffs into the lungs every morning.      calcium-vitamin D (OSCAL WITH D) 500-200 MG-UNIT per tablet Take 1 tablet by mouth every evening.      cyclobenzaprine (FLEXERIL) 10 MG tablet Take 10 mg by mouth 3 (three) times daily as needed for muscle spasms.     diphenhydrAMINE (BENADRYL) 25 mg capsule Take 25 mg by mouth every 6 (six) hours as needed.      fluticasone (FLONASE) 50 MCG/ACT nasal spray Place 2 sprays into the nose daily.     ketotifen (ZADITOR) 0.025 % ophthalmic solution Apply 1 drop to eye as needed. For dry eyes      loratadine (CLARITIN) 10 MG tablet Take 10 mg by mouth every evening.      mometasone (ELOCON) 0.1 % cream Apply topically daily as needed.     montelukast (SINGULAIR) 10 MG tablet TAKE 1 TABLET BY MOUTH IN THE EVENING ONCE A DAY     olmesartan-hydrochlorothiazide (BENICAR HCT) 20-12.5 MG per tablet Take 1 tablet by mouth every morning.      albuterol (PROVENTIL HFA;VENTOLIN HFA) 108 (90 BASE) MCG/ACT inhaler Inhale 2 puffs into the lungs every 6 (six) hours as needed. For SOB     celecoxib (CELEBREX) 200 MG capsule Take 200 mg by mouth daily as needed.     tolterodine (DETROL LA) 4 MG 24 hr capsule Take 4 mg by mouth daily as needed. (Patient not taking: Reported on 12/26/2021)     zolpidem (AMBIEN) 5 MG tablet Take 5 mg by mouth at bedtime as needed for sleep.     Current Facility-Administered Medications  Medication Dose Route Frequency Provider Last Rate Last Admin   0.9 %  sodium chloride infusion  500 mL Intravenous Once Mauri Pole, MD        Allergies as of 12/26/2021 - Review Complete 12/26/2021  Allergen Reaction Noted   Aspirin Nausea Only 10/13/2011   Sulfa antibiotics Nausea And Vomiting 10/13/2011   Strawberry extract Nausea And Vomiting and Rash 10/13/2011    Family History  Problem Relation Age of Onset   Colon polyps Sister    Stomach cancer Paternal Uncle    Anesthesia problems Neg Hx    Hypotension Neg Hx    Malignant hyperthermia Neg Hx    Pseudochol deficiency Neg Hx    Colon cancer Neg Hx    Rectal cancer Neg Hx    Esophageal cancer Neg Hx     Social History   Socioeconomic History   Marital status: Divorced    Spouse name: Not on file   Number of children: Not on file   Years of education: Not on file   Highest education level: Not on file  Occupational History    Not on file  Tobacco Use   Smoking status: Never   Smokeless tobacco: Never  Substance and Sexual Activity   Alcohol use: Yes    Alcohol/week: 1.0 standard drink    Types: 1 Glasses of wine per week    Comment: 1 glass a month   Drug use: No   Sexual activity: Yes    Birth control/protection: Surgical  Other Topics Concern   Not on file  Social History Narrative   Not on file   Social Determinants of Radio broadcast assistant  Strain: Not on file  Food Insecurity: Not on file  Transportation Needs: Not on file  Physical Activity: Not on file  Stress: Not on file  Social Connections: Not on file  Intimate Partner Violence: Not on file    Review of Systems:  All other review of systems negative except as mentioned in the HPI.  Physical Exam: Vital signs in last 24 hours: BP (!) 146/80    Pulse 95    Temp (!) 97.1 F (36.2 C) (Skin)    Ht 5\' 1"  (1.549 m)    Wt 150 lb (68 kg)    SpO2 100%    BMI 28.34 kg/m  General:   Alert, NAD Lungs:  Clear .   Heart:  Regular rate and rhythm Abdomen:  Soft, nontender and nondistended. Neuro/Psych:  Alert and cooperative. Normal mood and affect. A and O x 3  Reviewed labs, radiology imaging, old records and pertinent past GI work up  Patient is appropriate for planned procedure(s) and anesthesia in an ambulatory setting   K. Denzil Magnuson , MD [phone]

## 2021-12-26 NOTE — Patient Instructions (Signed)
Handout on diverticulosis, hemorrhoids, and polyps provided.  Await pathology results.  Repeat colonoscopy in 3-5 years for surveillance based on pathology results.   YOU HAD AN ENDOSCOPIC PROCEDURE TODAY AT [facility]:   Refer to the procedure report that was given to you for any specific questions about what was found during the examination.  If the procedure report does not answer your questions, please call your gastroenterologist to clarify.  If you requested that your care partner not be given the details of your procedure findings, then the procedure report has been included in a sealed envelope for you to review at your convenience later.  YOU SHOULD EXPECT: Some feelings of bloating in the abdomen. Passage of more gas than usual.  Walking can help get rid of the air that was put into your GI tract during the procedure and reduce the bloating. If you had a lower endoscopy (such as a colonoscopy or flexible sigmoidoscopy) you may notice spotting of blood in your stool or on the toilet paper. If you underwent a bowel prep for your procedure, you may not have a normal bowel movement for a few days.  Please Note:  You might notice some irritation and congestion in your nose or some drainage.  This is from the oxygen used during your procedure.  There is no need for concern and it should clear up in a day or so.  SYMPTOMS TO REPORT IMMEDIATELY:  Following lower endoscopy (colonoscopy or flexible sigmoidoscopy):  Excessive amounts of blood in the stool  Significant tenderness or worsening of abdominal pains  Swelling of the abdomen that is new, acute  Fever of 100F or higher   For urgent or emergent issues, a gastroenterologist can be reached at any hour by calling [phone]. Do not use MyChart messaging for urgent concerns.    DIET:  We do recommend a small meal at first, but then you may proceed to your regular diet.  Drink plenty of fluids but you should avoid  alcoholic beverages for 24 hours.  ACTIVITY:  You should plan to take it easy for the rest of today and you should NOT DRIVE or use heavy machinery until tomorrow (because of the sedation medicines used during the test).    FOLLOW UP: Our staff will call the number listed on your records 48-72 hours following your procedure to check on you and address any questions or concerns that you may have regarding the information given to you following your procedure. If we do not reach you, we will leave a message.  We will attempt to reach you two times.  During this call, we will ask if you have developed any symptoms of COVID 19. If you develop any symptoms (ie: fever, flu-like symptoms, shortness of breath, cough etc.) before then, please call [phone].  If you test positive for Covid 19 in the 2 weeks post procedure, please call and report this information to [place].    If any biopsies were taken you will be contacted by phone or by letter within the next 1-3 weeks.  Please call us at [phone] if you have not heard about the biopsies in 3 weeks.    SIGNATURES/CONFIDENTIALITY: You and/or your care partner have signed paperwork which will be entered into your electronic medical record.  These signatures attest to the fact that that the information above on your After Visit Summary has been reviewed and is understood.  Full responsibility of the confidentiality of this discharge information  lies with you and/or your care-partner.

## 2021-12-26 NOTE — Progress Notes (Signed)
Pt's states no medical or surgical changes since previsit or office visit. 

## 2021-12-26 NOTE — Op Note (Signed)
[facility] Patient Name: Alexis Guerrero Procedure Date: 12/26/2021 9:38 AM MRN: [id number] Endoscopist: Mauri Pole , MD Age: 71 Referring MD:  Date of Birth: 30-Jan-1951 Gender: Female Account #: [id number] Procedure:                Colonoscopy Indications:              High risk colon cancer surveillance: Personal                            history of colonic polyps, High risk colon cancer                            surveillance: Personal history of multiple (3 or                            more) adenomas Medicines:                Monitored Anesthesia Care Procedure:                Pre-Anesthesia Assessment:                           - Prior to the procedure, a History and Physical                            was performed, and patient medications and                            allergies were reviewed. The patient's tolerance of                            previous anesthesia was also reviewed. The risks                            and benefits of the procedure and the sedation                            options and risks were discussed with the patient.                            All questions were answered, and informed consent                            was obtained. Prior Anticoagulants: The patient has                            taken no previous anticoagulant or antiplatelet                            agents. ASA Grade Assessment: II - A patient with                            mild systemic disease. After reviewing the risks  and benefits, the patient was deemed in                            satisfactory condition to undergo the procedure.                           After obtaining informed consent, the colonoscope                            was passed under direct vision. Throughout the                            procedure, the patient's blood pressure, pulse, and                            oxygen saturations were monitored continuously.  The                            Olympus PCF-H190DL ([id number]) Colonoscope was                            introduced through the anus and advanced to the the                            cecum, identified by appendiceal orifice and                            ileocecal valve. The colonoscopy was performed                            without difficulty. The patient tolerated the                            procedure well. The quality of the bowel                            preparation was excellent. The ileocecal valve,                            appendiceal orifice, and rectum were photographed. Scope In: 9:46:26 AM Scope Out: 10:08:58 AM Scope Withdrawal Time: 0 hours 13 minutes 3 seconds  Total Procedure Duration: 0 hours 22 minutes 32 seconds  Findings:                 The perianal and digital rectal examinations were                            normal.                           Three sessile polyps were found in the transverse                            colon and ascending colon. The polyps were 4 to 7  mm in size. These polyps were removed with a cold                            snare. Resection and retrieval were complete.                           Two sessile polyps were found in the rectum. The                            polyps were 1 to 2 mm in size. These polyps were                            removed with a cold biopsy forceps. Resection and                            retrieval were complete.                           Scattered small and large-mouthed diverticula were                            found in the sigmoid colon, descending colon and                            transverse colon. There was evidence of an impacted                            diverticulum.                           Non-bleeding external and internal hemorrhoids were                            found during retroflexion. The hemorrhoids were                             medium-sized. Complications:            No immediate complications. Estimated Blood Loss:     Estimated blood loss was minimal. Impression:               - Three 4 to 7 mm polyps in the transverse colon                            and in the ascending colon, removed with a cold                            snare. Resected and retrieved.                           - Two 1 to 2 mm polyps in the rectum, removed with                            a cold biopsy forceps. Resected and retrieved.                           -  Moderate diverticulosis in the sigmoid colon, in                            the descending colon and in the transverse colon.                            There was evidence of an impacted diverticulum.                           - Non-bleeding external and internal hemorrhoids. Recommendation:           - Patient has a contact number available for                            emergencies. The signs and symptoms of potential                            delayed complications were discussed with the                            patient. Return to normal activities tomorrow.                            Written discharge instructions were provided to the                            patient.                           - Resume previous diet.                           - Continue present medications.                           - Await pathology results.                           - Repeat colonoscopy in 3 - 5 years for                            surveillance based on pathology results. Mauri Pole, MD 12/26/2021 10:13:35 AM This report has been signed electronically.

## 2021-12-26 NOTE — Progress Notes (Signed)
To pacu, VSS. Report to [email or web address] 

## 2021-12-30 ENCOUNTER — Telehealth: Payer: Self-pay | Admitting: *Deleted

## 2021-12-30 NOTE — Telephone Encounter (Signed)
°  Follow up Call-  Call back number 12/26/2021  Post procedure Call Back phone  # [phone]  Permission to leave phone message Yes  Some recent data might be hidden     Patient questions:  Do you have a fever, pain , or abdominal swelling? No. Pain Score  0 *  Have you tolerated food without any problems? Yes.    Have you been able to return to your normal activities? Yes.    Do you have any questions about your discharge instructions: Diet   No. Medications  No. Follow up visit  No.  Do you have questions or concerns about your Care? No.  Actions: * If pain score is 4 or above: No action needed, pain <4.

## 2022-01-08 ENCOUNTER — Encounter: Payer: Self-pay | Admitting: Gastroenterology

## 2022-01-13 DIAGNOSIS — J338 Other polyp of sinus: Secondary | ICD-10-CM | POA: Diagnosis not present

## 2022-01-13 DIAGNOSIS — J343 Hypertrophy of nasal turbinates: Secondary | ICD-10-CM | POA: Diagnosis not present

## 2022-01-13 DIAGNOSIS — J310 Chronic rhinitis: Secondary | ICD-10-CM | POA: Diagnosis not present

## 2022-02-03 DIAGNOSIS — G479 Sleep disorder, unspecified: Secondary | ICD-10-CM | POA: Diagnosis not present

## 2022-02-03 DIAGNOSIS — Z0000 Encounter for general adult medical examination without abnormal findings: Secondary | ICD-10-CM | POA: Diagnosis not present

## 2022-02-03 DIAGNOSIS — I10 Essential (primary) hypertension: Secondary | ICD-10-CM | POA: Diagnosis not present

## 2022-02-03 DIAGNOSIS — J45909 Unspecified asthma, uncomplicated: Secondary | ICD-10-CM | POA: Diagnosis not present

## 2022-02-03 DIAGNOSIS — M546 Pain in thoracic spine: Secondary | ICD-10-CM | POA: Diagnosis not present

## 2022-02-03 DIAGNOSIS — J309 Allergic rhinitis, unspecified: Secondary | ICD-10-CM | POA: Diagnosis not present

## 2022-05-06 DIAGNOSIS — I10 Essential (primary) hypertension: Secondary | ICD-10-CM | POA: Diagnosis not present

## 2022-05-18 DIAGNOSIS — H1045 Other chronic allergic conjunctivitis: Secondary | ICD-10-CM | POA: Diagnosis not present

## 2022-05-18 DIAGNOSIS — J300 Vasomotor rhinitis: Secondary | ICD-10-CM | POA: Diagnosis not present

## 2022-05-18 DIAGNOSIS — J4530 Mild persistent asthma, uncomplicated: Secondary | ICD-10-CM | POA: Diagnosis not present

## 2022-07-09 ENCOUNTER — Other Ambulatory Visit: Payer: Self-pay | Admitting: Family Medicine

## 2022-07-09 DIAGNOSIS — Z1231 Encounter for screening mammogram for malignant neoplasm of breast: Secondary | ICD-10-CM

## 2022-07-15 DIAGNOSIS — J310 Chronic rhinitis: Secondary | ICD-10-CM | POA: Diagnosis not present

## 2022-07-15 DIAGNOSIS — J343 Hypertrophy of nasal turbinates: Secondary | ICD-10-CM | POA: Diagnosis not present

## 2022-07-15 DIAGNOSIS — J338 Other polyp of sinus: Secondary | ICD-10-CM | POA: Diagnosis not present

## 2022-07-23 DIAGNOSIS — D164 Benign neoplasm of bones of skull and face: Secondary | ICD-10-CM | POA: Diagnosis not present

## 2022-08-13 ENCOUNTER — Ambulatory Visit
Admission: RE | Admit: 2022-08-13 | Discharge: 2022-08-13 | Disposition: A | Discharge status: Home / Self Care | Payer: Medicare Other | Source: Ambulatory Visit | Attending: Family Medicine | Admitting: Family Medicine

## 2022-08-13 DIAGNOSIS — Z1231 Encounter for screening mammogram for malignant neoplasm of breast: Secondary | ICD-10-CM | POA: Diagnosis not present

## 2022-09-09 DIAGNOSIS — Z713 Dietary counseling and surveillance: Secondary | ICD-10-CM | POA: Diagnosis not present

## 2022-11-27 DIAGNOSIS — J4530 Mild persistent asthma, uncomplicated: Secondary | ICD-10-CM | POA: Diagnosis not present

## 2022-11-27 DIAGNOSIS — H1045 Other chronic allergic conjunctivitis: Secondary | ICD-10-CM | POA: Diagnosis not present

## 2022-11-27 DIAGNOSIS — J300 Vasomotor rhinitis: Secondary | ICD-10-CM | POA: Diagnosis not present

## 2022-11-27 DIAGNOSIS — J339 Nasal polyp, unspecified: Secondary | ICD-10-CM | POA: Diagnosis not present

## 2022-12-21 DIAGNOSIS — Z961 Presence of intraocular lens: Secondary | ICD-10-CM | POA: Diagnosis not present

## 2022-12-21 DIAGNOSIS — H52203 Unspecified astigmatism, bilateral: Secondary | ICD-10-CM | POA: Diagnosis not present

## 2022-12-21 DIAGNOSIS — H524 Presbyopia: Secondary | ICD-10-CM | POA: Diagnosis not present

## 2023-02-09 DIAGNOSIS — Z0000 Encounter for general adult medical examination without abnormal findings: Secondary | ICD-10-CM | POA: Diagnosis not present

## 2023-02-09 DIAGNOSIS — Z9181 History of falling: Secondary | ICD-10-CM | POA: Diagnosis not present

## 2023-02-11 DIAGNOSIS — Z23 Encounter for immunization: Secondary | ICD-10-CM | POA: Diagnosis not present

## 2023-03-03 DIAGNOSIS — R7303 Prediabetes: Secondary | ICD-10-CM | POA: Diagnosis not present

## 2023-03-03 DIAGNOSIS — I10 Essential (primary) hypertension: Secondary | ICD-10-CM | POA: Diagnosis not present

## 2023-03-09 DIAGNOSIS — G479 Sleep disorder, unspecified: Secondary | ICD-10-CM | POA: Diagnosis not present

## 2023-03-09 DIAGNOSIS — R7303 Prediabetes: Secondary | ICD-10-CM | POA: Diagnosis not present

## 2023-03-09 DIAGNOSIS — Z23 Encounter for immunization: Secondary | ICD-10-CM | POA: Diagnosis not present

## 2023-03-09 DIAGNOSIS — J302 Other seasonal allergic rhinitis: Secondary | ICD-10-CM | POA: Diagnosis not present

## 2023-03-09 DIAGNOSIS — I10 Essential (primary) hypertension: Secondary | ICD-10-CM | POA: Diagnosis not present

## 2023-03-09 DIAGNOSIS — Z86010 Personal history of colonic polyps: Secondary | ICD-10-CM | POA: Diagnosis not present

## 2023-05-24 DIAGNOSIS — J300 Vasomotor rhinitis: Secondary | ICD-10-CM | POA: Diagnosis not present

## 2023-05-24 DIAGNOSIS — J339 Nasal polyp, unspecified: Secondary | ICD-10-CM | POA: Diagnosis not present

## 2023-05-24 DIAGNOSIS — J4530 Mild persistent asthma, uncomplicated: Secondary | ICD-10-CM | POA: Diagnosis not present

## 2023-05-24 DIAGNOSIS — H1045 Other chronic allergic conjunctivitis: Secondary | ICD-10-CM | POA: Diagnosis not present

## 2023-07-07 ENCOUNTER — Other Ambulatory Visit: Payer: Self-pay | Admitting: Family Medicine

## 2023-07-07 DIAGNOSIS — Z1231 Encounter for screening mammogram for malignant neoplasm of breast: Secondary | ICD-10-CM

## 2023-07-13 DIAGNOSIS — H6503 Acute serous otitis media, bilateral: Secondary | ICD-10-CM | POA: Diagnosis not present

## 2023-07-13 DIAGNOSIS — J309 Allergic rhinitis, unspecified: Secondary | ICD-10-CM | POA: Diagnosis not present

## 2023-07-13 DIAGNOSIS — J329 Chronic sinusitis, unspecified: Secondary | ICD-10-CM | POA: Diagnosis not present

## 2023-07-13 DIAGNOSIS — R430 Anosmia: Secondary | ICD-10-CM | POA: Diagnosis not present

## 2023-07-13 DIAGNOSIS — J339 Nasal polyp, unspecified: Secondary | ICD-10-CM | POA: Diagnosis not present

## 2023-07-13 DIAGNOSIS — R438 Other disturbances of smell and taste: Secondary | ICD-10-CM | POA: Diagnosis not present

## 2023-08-17 ENCOUNTER — Ambulatory Visit
Admission: RE | Admit: 2023-08-17 | Discharge: 2023-08-17 | Disposition: A | Discharge status: Home / Self Care | Payer: Medicare Other | Source: Ambulatory Visit | Attending: Family Medicine | Admitting: Family Medicine

## 2023-08-17 DIAGNOSIS — Z1231 Encounter for screening mammogram for malignant neoplasm of breast: Secondary | ICD-10-CM | POA: Diagnosis not present

## 2023-10-25 DIAGNOSIS — J329 Chronic sinusitis, unspecified: Secondary | ICD-10-CM | POA: Diagnosis not present

## 2023-10-25 DIAGNOSIS — J309 Allergic rhinitis, unspecified: Secondary | ICD-10-CM | POA: Diagnosis not present

## 2023-10-25 DIAGNOSIS — H9193 Unspecified hearing loss, bilateral: Secondary | ICD-10-CM | POA: Diagnosis not present

## 2023-10-25 DIAGNOSIS — R438 Other disturbances of smell and taste: Secondary | ICD-10-CM | POA: Diagnosis not present

## 2023-10-25 DIAGNOSIS — J339 Nasal polyp, unspecified: Secondary | ICD-10-CM | POA: Diagnosis not present

## 2023-10-25 DIAGNOSIS — H9201 Otalgia, right ear: Secondary | ICD-10-CM | POA: Diagnosis not present

## 2023-10-25 DIAGNOSIS — R430 Anosmia: Secondary | ICD-10-CM | POA: Diagnosis not present

## 2023-11-15 DIAGNOSIS — M545 Low back pain, unspecified: Secondary | ICD-10-CM | POA: Diagnosis not present

## 2023-12-01 DIAGNOSIS — M2012 Hallux valgus (acquired), left foot: Secondary | ICD-10-CM | POA: Diagnosis not present

## 2023-12-01 DIAGNOSIS — M2011 Hallux valgus (acquired), right foot: Secondary | ICD-10-CM | POA: Diagnosis not present

## 2023-12-14 DIAGNOSIS — I10 Essential (primary) hypertension: Secondary | ICD-10-CM | POA: Diagnosis not present

## 2023-12-14 DIAGNOSIS — G479 Sleep disorder, unspecified: Secondary | ICD-10-CM | POA: Diagnosis not present

## 2023-12-14 DIAGNOSIS — J309 Allergic rhinitis, unspecified: Secondary | ICD-10-CM | POA: Diagnosis not present

## 2023-12-14 DIAGNOSIS — R7303 Prediabetes: Secondary | ICD-10-CM | POA: Diagnosis not present

## 2023-12-14 DIAGNOSIS — M546 Pain in thoracic spine: Secondary | ICD-10-CM | POA: Diagnosis not present

## 2024-01-31 DIAGNOSIS — R7303 Prediabetes: Secondary | ICD-10-CM | POA: Diagnosis not present

## 2024-01-31 DIAGNOSIS — I10 Essential (primary) hypertension: Secondary | ICD-10-CM | POA: Diagnosis not present

## 2024-02-14 DIAGNOSIS — Z0000 Encounter for general adult medical examination without abnormal findings: Secondary | ICD-10-CM | POA: Diagnosis not present

## 2024-02-15 ENCOUNTER — Other Ambulatory Visit: Payer: Self-pay | Admitting: Family Medicine

## 2024-02-15 DIAGNOSIS — M8588 Other specified disorders of bone density and structure, other site: Secondary | ICD-10-CM

## 2024-03-14 DIAGNOSIS — H40013 Open angle with borderline findings, low risk, bilateral: Secondary | ICD-10-CM | POA: Diagnosis not present

## 2024-03-14 DIAGNOSIS — Z961 Presence of intraocular lens: Secondary | ICD-10-CM | POA: Diagnosis not present

## 2024-03-20 DIAGNOSIS — J339 Nasal polyp, unspecified: Secondary | ICD-10-CM | POA: Diagnosis not present

## 2024-03-20 DIAGNOSIS — J309 Allergic rhinitis, unspecified: Secondary | ICD-10-CM | POA: Diagnosis not present

## 2024-03-20 DIAGNOSIS — J329 Chronic sinusitis, unspecified: Secondary | ICD-10-CM | POA: Diagnosis not present

## 2024-06-19 DIAGNOSIS — M51361 Other intervertebral disc degeneration, lumbar region with lower extremity pain only: Secondary | ICD-10-CM | POA: Diagnosis not present

## 2024-06-19 DIAGNOSIS — M9905 Segmental and somatic dysfunction of pelvic region: Secondary | ICD-10-CM | POA: Diagnosis not present

## 2024-06-19 DIAGNOSIS — M9904 Segmental and somatic dysfunction of sacral region: Secondary | ICD-10-CM | POA: Diagnosis not present

## 2024-06-19 DIAGNOSIS — M9903 Segmental and somatic dysfunction of lumbar region: Secondary | ICD-10-CM | POA: Diagnosis not present

## 2024-06-21 DIAGNOSIS — M9904 Segmental and somatic dysfunction of sacral region: Secondary | ICD-10-CM | POA: Diagnosis not present

## 2024-06-21 DIAGNOSIS — M9905 Segmental and somatic dysfunction of pelvic region: Secondary | ICD-10-CM | POA: Diagnosis not present

## 2024-06-21 DIAGNOSIS — M51361 Other intervertebral disc degeneration, lumbar region with lower extremity pain only: Secondary | ICD-10-CM | POA: Diagnosis not present

## 2024-06-21 DIAGNOSIS — M9903 Segmental and somatic dysfunction of lumbar region: Secondary | ICD-10-CM | POA: Diagnosis not present

## 2024-06-26 DIAGNOSIS — M51361 Other intervertebral disc degeneration, lumbar region with lower extremity pain only: Secondary | ICD-10-CM | POA: Diagnosis not present

## 2024-06-26 DIAGNOSIS — M9904 Segmental and somatic dysfunction of sacral region: Secondary | ICD-10-CM | POA: Diagnosis not present

## 2024-06-26 DIAGNOSIS — M9905 Segmental and somatic dysfunction of pelvic region: Secondary | ICD-10-CM | POA: Diagnosis not present

## 2024-06-26 DIAGNOSIS — M9903 Segmental and somatic dysfunction of lumbar region: Secondary | ICD-10-CM | POA: Diagnosis not present

## 2024-06-28 DIAGNOSIS — M9903 Segmental and somatic dysfunction of lumbar region: Secondary | ICD-10-CM | POA: Diagnosis not present

## 2024-06-28 DIAGNOSIS — M51361 Other intervertebral disc degeneration, lumbar region with lower extremity pain only: Secondary | ICD-10-CM | POA: Diagnosis not present

## 2024-06-28 DIAGNOSIS — M9905 Segmental and somatic dysfunction of pelvic region: Secondary | ICD-10-CM | POA: Diagnosis not present

## 2024-06-28 DIAGNOSIS — M9904 Segmental and somatic dysfunction of sacral region: Secondary | ICD-10-CM | POA: Diagnosis not present

## 2024-07-03 DIAGNOSIS — M9905 Segmental and somatic dysfunction of pelvic region: Secondary | ICD-10-CM | POA: Diagnosis not present

## 2024-07-03 DIAGNOSIS — M9903 Segmental and somatic dysfunction of lumbar region: Secondary | ICD-10-CM | POA: Diagnosis not present

## 2024-07-03 DIAGNOSIS — M51361 Other intervertebral disc degeneration, lumbar region with lower extremity pain only: Secondary | ICD-10-CM | POA: Diagnosis not present

## 2024-07-03 DIAGNOSIS — M9904 Segmental and somatic dysfunction of sacral region: Secondary | ICD-10-CM | POA: Diagnosis not present

## 2024-07-04 ENCOUNTER — Other Ambulatory Visit: Payer: Self-pay | Admitting: Family Medicine

## 2024-07-04 DIAGNOSIS — Z1231 Encounter for screening mammogram for malignant neoplasm of breast: Secondary | ICD-10-CM

## 2024-07-05 DIAGNOSIS — H1045 Other chronic allergic conjunctivitis: Secondary | ICD-10-CM | POA: Diagnosis not present

## 2024-07-05 DIAGNOSIS — J4530 Mild persistent asthma, uncomplicated: Secondary | ICD-10-CM | POA: Diagnosis not present

## 2024-07-05 DIAGNOSIS — J300 Vasomotor rhinitis: Secondary | ICD-10-CM | POA: Diagnosis not present

## 2024-07-05 DIAGNOSIS — J339 Nasal polyp, unspecified: Secondary | ICD-10-CM | POA: Diagnosis not present

## 2024-07-10 DIAGNOSIS — M9905 Segmental and somatic dysfunction of pelvic region: Secondary | ICD-10-CM | POA: Diagnosis not present

## 2024-07-10 DIAGNOSIS — M51361 Other intervertebral disc degeneration, lumbar region with lower extremity pain only: Secondary | ICD-10-CM | POA: Diagnosis not present

## 2024-07-10 DIAGNOSIS — M9904 Segmental and somatic dysfunction of sacral region: Secondary | ICD-10-CM | POA: Diagnosis not present

## 2024-07-10 DIAGNOSIS — M9903 Segmental and somatic dysfunction of lumbar region: Secondary | ICD-10-CM | POA: Diagnosis not present

## 2024-07-17 DIAGNOSIS — M9904 Segmental and somatic dysfunction of sacral region: Secondary | ICD-10-CM | POA: Diagnosis not present

## 2024-07-17 DIAGNOSIS — M9905 Segmental and somatic dysfunction of pelvic region: Secondary | ICD-10-CM | POA: Diagnosis not present

## 2024-07-17 DIAGNOSIS — M51361 Other intervertebral disc degeneration, lumbar region with lower extremity pain only: Secondary | ICD-10-CM | POA: Diagnosis not present

## 2024-07-17 DIAGNOSIS — M9903 Segmental and somatic dysfunction of lumbar region: Secondary | ICD-10-CM | POA: Diagnosis not present

## 2024-07-25 DIAGNOSIS — M9903 Segmental and somatic dysfunction of lumbar region: Secondary | ICD-10-CM | POA: Diagnosis not present

## 2024-07-25 DIAGNOSIS — M9905 Segmental and somatic dysfunction of pelvic region: Secondary | ICD-10-CM | POA: Diagnosis not present

## 2024-07-25 DIAGNOSIS — M51361 Other intervertebral disc degeneration, lumbar region with lower extremity pain only: Secondary | ICD-10-CM | POA: Diagnosis not present

## 2024-07-25 DIAGNOSIS — M9904 Segmental and somatic dysfunction of sacral region: Secondary | ICD-10-CM | POA: Diagnosis not present

## 2024-07-31 DIAGNOSIS — M9905 Segmental and somatic dysfunction of pelvic region: Secondary | ICD-10-CM | POA: Diagnosis not present

## 2024-07-31 DIAGNOSIS — M9904 Segmental and somatic dysfunction of sacral region: Secondary | ICD-10-CM | POA: Diagnosis not present

## 2024-07-31 DIAGNOSIS — M51361 Other intervertebral disc degeneration, lumbar region with lower extremity pain only: Secondary | ICD-10-CM | POA: Diagnosis not present

## 2024-07-31 DIAGNOSIS — M9903 Segmental and somatic dysfunction of lumbar region: Secondary | ICD-10-CM | POA: Diagnosis not present

## 2024-08-02 ENCOUNTER — Other Ambulatory Visit (HOSPITAL_BASED_OUTPATIENT_CLINIC_OR_DEPARTMENT_OTHER)

## 2024-08-07 DIAGNOSIS — M9903 Segmental and somatic dysfunction of lumbar region: Secondary | ICD-10-CM | POA: Diagnosis not present

## 2024-08-07 DIAGNOSIS — M51361 Other intervertebral disc degeneration, lumbar region with lower extremity pain only: Secondary | ICD-10-CM | POA: Diagnosis not present

## 2024-08-07 DIAGNOSIS — M9905 Segmental and somatic dysfunction of pelvic region: Secondary | ICD-10-CM | POA: Diagnosis not present

## 2024-08-07 DIAGNOSIS — M9904 Segmental and somatic dysfunction of sacral region: Secondary | ICD-10-CM | POA: Diagnosis not present

## 2024-08-14 DIAGNOSIS — M9904 Segmental and somatic dysfunction of sacral region: Secondary | ICD-10-CM | POA: Diagnosis not present

## 2024-08-14 DIAGNOSIS — M51361 Other intervertebral disc degeneration, lumbar region with lower extremity pain only: Secondary | ICD-10-CM | POA: Diagnosis not present

## 2024-08-14 DIAGNOSIS — M9905 Segmental and somatic dysfunction of pelvic region: Secondary | ICD-10-CM | POA: Diagnosis not present

## 2024-08-14 DIAGNOSIS — M9903 Segmental and somatic dysfunction of lumbar region: Secondary | ICD-10-CM | POA: Diagnosis not present

## 2024-08-18 ENCOUNTER — Ambulatory Visit
Admission: RE | Admit: 2024-08-18 | Discharge: 2024-08-18 | Disposition: A | Discharge status: Home / Self Care | Source: Ambulatory Visit | Attending: Family Medicine | Admitting: Family Medicine

## 2024-08-18 DIAGNOSIS — Z1231 Encounter for screening mammogram for malignant neoplasm of breast: Secondary | ICD-10-CM

## 2024-10-18 ENCOUNTER — Other Ambulatory Visit

## 2024-12-27 ENCOUNTER — Other Ambulatory Visit (HOSPITAL_COMMUNITY): Payer: Self-pay

## 2024-12-29 ENCOUNTER — Telehealth (HOSPITAL_COMMUNITY): Payer: Self-pay | Admitting: Pharmacy Technician

## 2024-12-29 ENCOUNTER — Other Ambulatory Visit: Payer: Self-pay

## 2024-12-29 ENCOUNTER — Other Ambulatory Visit: Payer: Self-pay | Admitting: Pharmacy Technician

## 2024-12-29 ENCOUNTER — Other Ambulatory Visit (HOSPITAL_COMMUNITY): Payer: Self-pay

## 2024-12-29 MED ORDER — DUPIXENT 300 MG/2ML ~~LOC~~ SOSY: 300.0000 mg | PREFILLED_SYRINGE | SUBCUTANEOUS | 11 refills | Status: AC

## 2024-12-29 NOTE — Telephone Encounter (Signed)
 Pharmacy Patient Advocate Encounter  Insurance verification completed.   The patient is insured through [facility] ADV[employer] ADVANCE   Ran test claim for Dupixent . Currently a quantity of 4ml is a 28 day supply.  The current 28 day co-pay is, $1,206.16.  No PA needed at this time. Patient is enrolled into a MPPP per test claim findings.  This test claim was processed through [facility]- copay amounts may vary at other pharmacies due to pharmacy/plan contracts, or as the patient moves through the different stages of their insurance plan.

## 2024-12-29 NOTE — Progress Notes (Signed)
 Test claim successful, no PA required- Patient enrolled into MPPP

## 2025-01-25 ENCOUNTER — Other Ambulatory Visit (HOSPITAL_BASED_OUTPATIENT_CLINIC_OR_DEPARTMENT_OTHER)
